# Patient Record
Sex: Male | Born: 1960 | Hispanic: No | Marital: Single | Smoking: Never smoker
Health system: Southern US, Community
[De-identification: ages and names within clinical notes are randomized; demographics above are authoritative.]

## PROBLEM LIST (undated history)

## (undated) DIAGNOSIS — R7303 Prediabetes: Secondary | ICD-10-CM

## (undated) DIAGNOSIS — B019 Varicella without complication: Secondary | ICD-10-CM

## (undated) DIAGNOSIS — R011 Cardiac murmur, unspecified: Secondary | ICD-10-CM

## (undated) DIAGNOSIS — R42 Dizziness and giddiness: Secondary | ICD-10-CM

## (undated) HISTORY — DX: Dizziness and giddiness: R42

## (undated) HISTORY — DX: Prediabetes: R73.03

## (undated) HISTORY — DX: Cardiac murmur, unspecified: R01.1

## (undated) HISTORY — DX: Varicella without complication: B01.9

---

## 2004-09-07 ENCOUNTER — Emergency Department: Payer: Self-pay | Admitting: Unknown Physician Specialty

## 2006-12-16 ENCOUNTER — Encounter: Admission: RE | Admit: 2006-12-16 | Discharge: 2006-12-30 | Payer: Self-pay | Admitting: Family Medicine

## 2008-12-07 ENCOUNTER — Inpatient Hospital Stay: Payer: Self-pay | Admitting: General Surgery

## 2009-05-28 DIAGNOSIS — R011 Cardiac murmur, unspecified: Secondary | ICD-10-CM

## 2009-05-28 HISTORY — DX: Cardiac murmur, unspecified: R01.1

## 2010-05-28 HISTORY — PX: CHOLECYSTECTOMY: SHX55

## 2010-09-29 ENCOUNTER — Emergency Department: Payer: Self-pay | Admitting: Emergency Medicine

## 2012-05-01 ENCOUNTER — Encounter (HOSPITAL_COMMUNITY): Payer: Self-pay | Admitting: *Deleted

## 2012-05-01 ENCOUNTER — Emergency Department (HOSPITAL_COMMUNITY)
Admission: EM | Admit: 2012-05-01 | Discharge: 2012-05-01 | Disposition: A | Discharge status: Home / Self Care | Payer: BC Managed Care – PPO | Attending: Emergency Medicine | Admitting: Emergency Medicine

## 2012-05-01 DIAGNOSIS — H00019 Hordeolum externum unspecified eye, unspecified eyelid: Secondary | ICD-10-CM | POA: Insufficient documentation

## 2012-05-01 DIAGNOSIS — H5789 Other specified disorders of eye and adnexa: Secondary | ICD-10-CM | POA: Insufficient documentation

## 2012-05-01 MED ORDER — TOBRAMYCIN 0.3 % OP SOLN
2.0000 [drp] | OPHTHALMIC | Status: DC
  Administered 2012-05-01: 2 [drp] via OPHTHALMIC
  Filled 2012-05-01: qty 5

## 2012-05-01 NOTE — ED Notes (Signed)
Pt noticed bump to the right eye lid this morning. Pt states right eye lid has some swelling. Pt denies any fever or injury.

## 2012-05-01 NOTE — ED Provider Notes (Signed)
History     CSN: [id number]  Arrival date & time 05/01/12  4098   First MD Initiated Contact with Patient 05/01/12 1945      Chief Complaint  Patient presents with  . Eye Problem    (Consider location/radiation/quality/duration/timing/severity/associated sxs/prior treatment) HPI Comments: Patient states that on yesterday he felt a bump on the top lid of the right eye and scratched it. He states this morning when he got up the upper lip is swollen. The eye was somewhat red. And he's been having a mild discomfort (2/10) most of the day. He's not had any drainage or discharge from the eye. He's not had any vision changes with the eye. And he's not had any problem temperature elevation.  Patient is a 51 y.o. male presenting with eye problem. The history is provided by the patient.  Eye Problem  This is a new problem. The current episode started yesterday. The problem occurs constantly. The problem has not changed since onset.The right eye is affected.There was no injury mechanism. The pain is at a severity of 2/10. Associated symptoms include eye redness. Pertinent negatives include no discharge, no double vision and no photophobia.    History reviewed. No pertinent past medical history.  History reviewed. No pertinent past surgical history.  No family history on file.  History  Substance Use Topics  . Smoking status: Not on file  . Smokeless tobacco: Not on file  . Alcohol Use: No      Review of Systems  Constitutional: Negative for activity change.       All ROS Neg except as noted in HPI  HENT: Negative for nosebleeds and neck pain.   Eyes: Positive for redness and itching. Negative for double vision, photophobia and discharge.  Respiratory: Negative for cough, shortness of breath and wheezing.   Cardiovascular: Negative for chest pain and palpitations.  Gastrointestinal: Negative for abdominal pain and blood in stool.  Genitourinary: Negative for dysuria, frequency and  hematuria.  Musculoskeletal: Negative for back pain and arthralgias.  Skin: Negative.   Neurological: Negative for dizziness, seizures and speech difficulty.  Psychiatric/Behavioral: Negative for hallucinations and confusion.    Allergies  Review of patient's allergies indicates no known allergies.  Home Medications  No current outpatient prescriptions on file.  BP 127/90  Pulse 72  Temp 98.8 F (37.1 C) (Oral)  Resp 20  Ht 6' (1.829 m)  Wt 270 lb (122.471 kg)  BMI 36.62 kg/m2  SpO2 100%  Physical Exam  Nursing note and vitals reviewed. Constitutional: He is oriented to person, place, and time. He appears well-developed and well-nourished.  Non-toxic appearance.  HENT:  Head: Normocephalic.  Right Ear: Tympanic membrane and external ear normal.  Left Ear: Tympanic membrane and external ear normal.  Eyes: EOM and lids are normal. Pupils are equal, round, and reactive to light.       The right upper lid is mildly swollen. There is no drainage or discharge from the eye. Within the upper lid was flipped. There is no pus he had noted there is some mild swelling present. There is mild increased redness of the conjunctiva. The anterior chamber is clear. The extraocular movements are intact. The pupils are equal and reactive to light. There is no pain or redness around the periorbital area.  Neck: Normal range of motion. Neck supple. Carotid bruit is not present.  Cardiovascular: Normal rate, regular rhythm, normal heart sounds, intact distal pulses and normal pulses.   Pulmonary/Chest: Breath sounds normal. No  respiratory distress.  Abdominal: Soft. Bowel sounds are normal. There is no tenderness. There is no guarding.  Musculoskeletal: Normal range of motion.  Lymphadenopathy:       Head (right side): No submandibular adenopathy present.       Head (left side): No submandibular adenopathy present.    He has no cervical adenopathy.  Neurological: He is alert and oriented to person,  place, and time. He has normal strength. No cranial nerve deficit or sensory deficit.  Skin: Skin is warm and dry.  Psychiatric: He has a normal mood and affect. His speech is normal.    ED Course  Procedures (including critical care time)  Labs Reviewed - No data to display No results found.   1. Sty       MDM   I have reviewed nursing notes, vital signs, and all appropriate lab and imaging results for this patient.I suspect the patient has a stye of the right eye. Visual acquity wnl. Will treat with tobramycin eyedrops and warm compresses. Patient is referred to ophthalmology if not improving. Patient also advised to wash hands frequently. He is to return to the emergency department if any urgent changes before he is seen by ophthalmology.       Kathie Dike, [place] 05/01/12 2028

## 2012-05-01 NOTE — ED Notes (Signed)
Patient woke up this morning with right eye pain. Started rubbing it and is red and feels worse.

## 2012-05-01 NOTE — ED Notes (Signed)
Pt alert & oriented x4, stable gait. Patient given discharge instructions, paperwork & prescription(s). Patient  instructed to stop at the registration desk to finish any additional paperwork. Patient verbalized understanding. Pt left department w/ no further questions. 

## 2012-05-02 NOTE — ED Provider Notes (Signed)
Medical screening examination/treatment/procedure(s) were performed by non-physician practitioner and as supervising physician I was immediately available for consultation/collaboration.   Shelda Jakes, MD 05/02/12 [phone]

## 2012-06-28 ENCOUNTER — Encounter (HOSPITAL_COMMUNITY): Payer: Self-pay | Admitting: *Deleted

## 2012-06-28 ENCOUNTER — Emergency Department (HOSPITAL_COMMUNITY)
Admission: EM | Admit: 2012-06-28 | Discharge: 2012-06-28 | Disposition: A | Discharge status: Home / Self Care | Payer: BC Managed Care – PPO | Attending: Emergency Medicine | Admitting: Emergency Medicine

## 2012-06-28 DIAGNOSIS — R51 Headache: Secondary | ICD-10-CM | POA: Insufficient documentation

## 2012-06-28 DIAGNOSIS — R110 Nausea: Secondary | ICD-10-CM | POA: Insufficient documentation

## 2012-06-28 DIAGNOSIS — R197 Diarrhea, unspecified: Secondary | ICD-10-CM | POA: Insufficient documentation

## 2012-06-28 MED ORDER — IBUPROFEN 600 MG PO TABS: 600.0000 mg | ORAL_TABLET | Freq: Three times a day (TID) | ORAL | Status: DC | PRN

## 2012-06-28 MED ORDER — ONDANSETRON 8 MG PO TBDP: 8.0000 mg | ORAL_TABLET | Freq: Three times a day (TID) | ORAL | Status: DC | PRN

## 2012-06-28 MED ORDER — ACETAMINOPHEN 325 MG PO TABS
ORAL_TABLET | ORAL | Status: AC
  Administered 2012-06-28: 650 mg via ORAL
  Filled 2012-06-28: qty 2

## 2012-06-28 MED ORDER — ACETAMINOPHEN 325 MG PO TABS
650.0000 mg | ORAL_TABLET | Freq: Once | ORAL | Status: AC
  Administered 2012-06-28: 650 mg via ORAL

## 2012-06-28 MED ORDER — ONDANSETRON 8 MG PO TBDP
8.0000 mg | ORAL_TABLET | Freq: Once | ORAL | Status: AC
  Administered 2012-06-28: 8 mg via ORAL
  Filled 2012-06-28: qty 1

## 2012-06-28 NOTE — ED Notes (Signed)
Pt states fever and diarrhea began last night. 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

## 2012-06-28 NOTE — ED Provider Notes (Signed)
History     CSN: [id number]  Arrival date & time 06/28/12  1207   First MD Initiated Contact with Patient 06/28/12 1239      Chief Complaint  Patient presents with  . Fever  . Diarrhea     The history is provided by the patient.   patient reports nausea and diarrhea that began last night.  He has had no vomiting.  He continues to tolerate oral fluids at home but states he is having to "force them down".  He's had fever and chills.  He has mild headache at this time.  Weakness of his upper lower extremities.  No abdominal pain.  No melena or hematochezia.  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  No significant medical history  History reviewed. No pertinent past medical history.  Past Surgical History  Procedure Date  . Cholecystectomy     No family history on file.  History  Substance Use Topics  . Smoking status: Never Smoker   . Smokeless tobacco: Not on file  . Alcohol Use: No      Review of Systems  Constitutional: Positive for fever.  Gastrointestinal: Positive for diarrhea.  All other systems reviewed and are negative.    Allergies  Review of patient's allergies indicates no known allergies.  Home Medications   Current Outpatient Rx  Name  Route  Sig  Dispense  Refill  . IBUPROFEN 600 MG PO TABS   Oral   Take 1 tablet (600 mg total) by mouth every 8 (eight) hours as needed for pain.   15 tablet   0   . ONDANSETRON 8 MG PO TBDP   Oral   Take 1 tablet (8 mg total) by mouth every 8 (eight) hours as needed for nausea.   10 tablet   0     BP 127/75  Pulse 113  Temp 99.6 F (37.6 C) (Oral)  Resp 18  Ht 6' (1.829 m)  Wt 270 lb (122.471 kg)  BMI 36.62 kg/m2  SpO2 96%  Physical Exam  Nursing note and vitals reviewed. Constitutional: He is oriented to person, place, and time. He appears well-developed and well-nourished.  HENT:  Head:  Normocephalic and atraumatic.  Eyes: EOM are normal.  Neck: Normal range of motion.  Cardiovascular: Normal rate, regular rhythm, normal heart sounds and intact distal pulses.   Pulmonary/Chest: Effort normal and breath sounds normal. No respiratory distress.  Abdominal: Soft. He exhibits no distension. There is no tenderness.  Musculoskeletal: Normal range of motion.  Neurological: He is alert and oriented to person, place, and time.  Skin: Skin is warm and dry.  Psychiatric: He has a normal mood and affect. Judgment normal.    ED Course  Procedures (including critical care time)  Labs Reviewed - No data to display No results found.   1. Nausea   2. Diarrhea   3. Headache       MDM  1:15 PM Patient was given oral nausea medicine.  Home to orally hydrate himself.  I don't believe the patient needs IV hydration the emergency department.  Headache treated.  Close PCP followup.  He understands return to the ER for new or worsening symptoms        Lyanne Co, MD 06/28/12 1316

## 2013-08-07 ENCOUNTER — Emergency Department: Payer: Self-pay | Admitting: Emergency Medicine

## 2013-08-17 ENCOUNTER — Ambulatory Visit: Payer: Self-pay | Admitting: Family Medicine

## 2013-12-24 LAB — LIPID PANEL
CHOLESTEROL: 196 mg/dL (ref 0–200)
HDL: 36 mg/dL (ref 35–70)
LDL Cholesterol: 114 mg/dL
TRIGLYCERIDES: 229 mg/dL — AB (ref 40–160)

## 2013-12-24 LAB — HEMOGLOBIN A1C: HEMOGLOBIN A1C: 6.1 % — AB (ref 4.0–6.0)

## 2013-12-24 LAB — TSH: TSH: 1.49 u[IU]/mL (ref <=5.90)

## 2014-12-15 ENCOUNTER — Encounter: Payer: Self-pay | Admitting: Primary Care

## 2014-12-15 ENCOUNTER — Ambulatory Visit (INDEPENDENT_AMBULATORY_CARE_PROVIDER_SITE_OTHER): Payer: Managed Care, Other (non HMO) | Admitting: Primary Care

## 2014-12-15 ENCOUNTER — Encounter (INDEPENDENT_AMBULATORY_CARE_PROVIDER_SITE_OTHER): Payer: Self-pay

## 2014-12-15 VITALS — BP 126/84 | HR 72 | Temp 98.2°F | Ht 70.0 in | Wt 263.8 lb

## 2014-12-15 DIAGNOSIS — E669 Obesity, unspecified: Secondary | ICD-10-CM

## 2014-12-15 NOTE — Progress Notes (Signed)
Pre visit review using our clinic review tool, if applicable. No additional management support is needed unless otherwise documented below in the visit note. 

## 2014-12-15 NOTE — Patient Instructions (Signed)
Please schedule a physical with me in the next month at your convienence. You will also schedule a lab only appointment one week prior. We will discuss your lab results during your physical.  It was a pleasure to meet you today! Please don't hesitate to call me with any questions. Welcome to [facility]!

## 2014-12-15 NOTE — Assessment & Plan Note (Signed)
Endorses healthy diet. Limited exercise. Will do labs and further evaluation at upcoming physical.

## 2014-12-15 NOTE — Progress Notes (Signed)
   Subjective:    Patient ID: Anastasia Fiedler., male    DOB: July 24, 1960, 54 y.o.   MRN: [id number]  HPI  Mr. Reznick is a 54 year old male who presents today to establish care and discuss the problems mentioned below. Will obtain old records.  1) Obesity: Endorses healthy lifestyle. Breakfast:Turkey sausage and egg whites, fruit, whole wheat toast, orange juice. Lunch: Grilled chicken, vegetables, baked seafood, soup. Dinner: Sub sandwich, baked fish or chicken, vegetables. Drinks mostly water or juice.  Does not eat pork or consume sodas. Limited consumption of sweets. He exercises by doing 30 minutes of cardio 3 days a week. His occupation is sedentary.  Review of Systems  Constitutional: Negative for fatigue and unexpected weight change.  HENT: Negative for rhinorrhea.   Respiratory: Negative for cough and shortness of breath.   Cardiovascular: Negative for chest pain.  Gastrointestinal: Negative for diarrhea and constipation.  Genitourinary: Negative for difficulty urinating.  Musculoskeletal: Negative for myalgias and arthralgias.  Skin: Negative for rash.  Neurological: Negative for dizziness, numbness and headaches.  Psychiatric/Behavioral:       Denies concerns for anxiety or depression.       Past Medical History  Diagnosis Date  . Chicken pox   . Heart murmur 2011    History   Social History  . Marital Status: Unknown    Spouse Name: N/A  . Number of Children: N/A  . Years of Education: N/A   Occupational History  . Not on file.   Social History Main Topics  . Smoking status: Never Smoker   . Smokeless tobacco: Not on file  . Alcohol Use: No  . Drug Use: No  . Sexual Activity: Not on file   Other Topics Concern  . 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   Works as a Marine scientist.   Highest level of education is bachelors.   Works for [employer].   Enjoys traveling, reading, collecting coins.    Past Surgical History    Procedure Laterality Date  . Cholecystectomy  2012    Family History  Problem Relation Age of Onset  . Diabetes Mother   . Diabetes Father   . Heart disease Father 96    Myocardial  . Kidney disease Father   . Stroke Father   . Hypertension Father     No Known Allergies  No current outpatient prescriptions on file prior to visit.   No current facility-administered medications on file prior to visit.    BP 126/84 mmHg  Pulse 72  Temp(Src) 98.2 F (36.8 C) (Oral)  Ht 5\' 10"  (1.778 m)  Wt 263 lb 12.8 oz (119.659 kg)  BMI 37.85 kg/m2  SpO2 98%    Objective:   Physical Exam  Constitutional: He is oriented to person, place, and time. He appears well-nourished.  Cardiovascular: Normal rate and regular rhythm.   Pulmonary/Chest: Effort normal and breath sounds normal.  Neurological: He is alert and oriented to person, place, and time.  Skin: Skin is warm and dry.  Psychiatric: He has a normal mood and affect.          Assessment & Plan:

## 2014-12-17 ENCOUNTER — Other Ambulatory Visit: Payer: Self-pay | Admitting: Primary Care

## 2014-12-17 DIAGNOSIS — Z0000 Encounter for general adult medical examination without abnormal findings: Secondary | ICD-10-CM

## 2014-12-20 ENCOUNTER — Other Ambulatory Visit (INDEPENDENT_AMBULATORY_CARE_PROVIDER_SITE_OTHER): Payer: Managed Care, Other (non HMO)

## 2014-12-20 DIAGNOSIS — R7989 Other specified abnormal findings of blood chemistry: Secondary | ICD-10-CM

## 2014-12-20 DIAGNOSIS — Z0000 Encounter for general adult medical examination without abnormal findings: Secondary | ICD-10-CM

## 2014-12-20 LAB — LDL CHOLESTEROL, DIRECT: Direct LDL: 153 mg/dL

## 2014-12-20 LAB — COMPREHENSIVE METABOLIC PANEL
ALBUMIN: 4.5 g/dL (ref 3.5–5.2)
ALT: 40 U/L (ref 0–53)
AST: 22 U/L (ref 0–37)
Alkaline Phosphatase: 46 U/L (ref 39–117)
BILIRUBIN TOTAL: 0.5 mg/dL (ref 0.2–1.2)
BUN: 18 mg/dL (ref 6–23)
CO2: 27 meq/L (ref 19–32)
Calcium: 10.1 mg/dL (ref 8.4–10.5)
Chloride: 105 mEq/L (ref 96–112)
Creatinine, Ser: 1.09 mg/dL (ref 0.40–1.50)
GFR: 75.55 mL/min (ref >60.00)
GLUCOSE: 97 mg/dL (ref 70–99)
Potassium: 4.7 mEq/L (ref 3.5–5.1)
Sodium: 140 mEq/L (ref 135–145)
TOTAL PROTEIN: 7.3 g/dL (ref 6.0–8.3)

## 2014-12-20 LAB — CBC
HEMATOCRIT: 46.2 % (ref 39.0–52.0)
HEMOGLOBIN: 15.5 g/dL (ref 13.0–17.0)
MCHC: 33.5 g/dL (ref 30.0–36.0)
MCV: 88.7 fl (ref 78.0–100.0)
PLATELETS: 260 10*3/uL (ref 150.0–400.0)
RBC: 5.21 Mil/uL (ref 4.22–5.81)
RDW: 13.2 % (ref 11.5–15.5)
WBC: 7.3 10*3/uL (ref 4.0–10.5)

## 2014-12-20 LAB — LIPID PANEL
CHOL/HDL RATIO: 5
Cholesterol: 238 mg/dL — ABNORMAL HIGH (ref 0–200)
HDL: 45.4 mg/dL (ref >39.00)
NONHDL: 192.6
Triglycerides: 211 mg/dL — ABNORMAL HIGH (ref 0.0–149.0)
VLDL: 42.2 mg/dL — AB (ref 0.0–40.0)

## 2014-12-20 LAB — PSA: PSA: 0.54 ng/mL (ref 0.10–4.00)

## 2014-12-20 LAB — TSH: TSH: 1.71 u[IU]/mL (ref 0.35–4.50)

## 2014-12-20 LAB — HEMOGLOBIN A1C: HEMOGLOBIN A1C: 5.7 % (ref 4.6–6.5)

## 2014-12-27 ENCOUNTER — Encounter: Payer: Self-pay | Admitting: Internal Medicine

## 2014-12-27 ENCOUNTER — Ambulatory Visit (INDEPENDENT_AMBULATORY_CARE_PROVIDER_SITE_OTHER): Payer: Managed Care, Other (non HMO) | Admitting: Primary Care

## 2014-12-27 ENCOUNTER — Encounter: Payer: Self-pay | Admitting: Primary Care

## 2014-12-27 VITALS — BP 118/82 | HR 71 | Temp 98.0°F | Ht 70.0 in | Wt 263.1 lb

## 2014-12-27 DIAGNOSIS — Z0000 Encounter for general adult medical examination without abnormal findings: Secondary | ICD-10-CM | POA: Insufficient documentation

## 2014-12-27 DIAGNOSIS — Z0001 Encounter for general adult medical examination with abnormal findings: Secondary | ICD-10-CM | POA: Insufficient documentation

## 2014-12-27 DIAGNOSIS — E669 Obesity, unspecified: Secondary | ICD-10-CM

## 2014-12-27 DIAGNOSIS — Z1211 Encounter for screening for malignant neoplasm of colon: Secondary | ICD-10-CM

## 2014-12-27 NOTE — Assessment & Plan Note (Signed)
Tetanus and flu UTD. Referral made for colonoscopy. Labs mostly unremarkable. Slightly elevated cholesterol and A1C of 5.7 Discussed importance of diet and exercise on health. He will start exercising. Exam unremarkable. Follow up in 3 months evaluation.

## 2014-12-27 NOTE — Patient Instructions (Signed)
Work to increase your activity. You need at least 1 hour of moderate intensity exercise 5 days weekly.  Work to decrease portion sizes.  Follow up in 3 months for repeat diabetes and cholesterol testing.  It was nice to see you!

## 2014-12-27 NOTE — Assessment & Plan Note (Addendum)
Endorses healthy diet. Discussed to cut back on juice and portion sizes. He is to start exercising 1 hour 5 days a week.

## 2014-12-27 NOTE — Progress Notes (Signed)
Subjective:    Patient ID: Stephen Ramos., male    DOB: 1960/11/07, 54 y.o.   MRN: [id number]  HPI  Stephen Ramos is a 54 year old male who presents today for complete physical.  Immunizations: -Tetanus: TD completed in 2010. -Influenza: Completed last season.   Diet: Endorses a healthy diet. Breakfast: [place] sausage, egg whites, fruit, whole wheat toast, juice. Lunch: Grilled chicken, vegetables, baked fish, soup Dinner: Sandwiches, baked lean meats, vegetables. No pork or red meat. Beverages: Drinks mostly water. Exercise: He is doing 30 minutes of cardio three days a week. (Walking around his neighborhood and elliptical). Overall his job is very sedentary. Eye exam: Completed 3 years ago. Denies changes in vision. Dental exam: Completes once every six months. Colonoscopy: He has not completed before.  Wt Readings from Last 3 Encounters:  12/27/14 263 lb 1.9 oz (119.35 kg)  12/15/14 263 lb 12.8 oz (119.659 kg)  10/13/14 261 lb (118.389 kg)     Review of Systems  Constitutional: Negative for unexpected weight change.  HENT: Negative for rhinorrhea.   Respiratory: Negative for cough and shortness of breath.   Cardiovascular: Negative for chest pain.  Gastrointestinal: Negative for diarrhea and constipation.  Genitourinary: Negative for difficulty urinating.  Musculoskeletal: Negative for myalgias and arthralgias.  Skin: Negative for rash.  Allergic/Immunologic: Negative for environmental allergies.  Neurological: Negative for dizziness, numbness and headaches.  Psychiatric/Behavioral:       Denies concerns for anxiety or depression       Past Medical History  Diagnosis Date  . Chicken pox   . Heart murmur 2011    History   Social History  . Marital Status: Unknown    Spouse Name: N/A  . Number of Children: N/A  . Years of Education: N/A   Occupational History  . Not on file.   Social History Main Topics  . Smoking status: Never Smoker   .  Smokeless tobacco: Not on file  . Alcohol Use: No  . Drug Use: No  . Sexual Activity: Not on file   Other Topics Concern  . 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   Works as a Marine scientist.   Highest level of education is bachelors.   Works for [employer].   Enjoys traveling, reading, collecting coins.    Past Surgical History  Procedure Laterality Date  . Cholecystectomy  2012    Family History  Problem Relation Age of Onset  . Diabetes Mother   . Diabetes Father   . Heart disease Father 48    Myocardial  . Kidney disease Father   . Stroke Father   . Hypertension Father     No Known Allergies  Current Outpatient Prescriptions on File Prior to Visit  Medication Sig Dispense Refill  . MULTIPLE VITAMIN PO Take 1 tablet by mouth daily.      No current facility-administered medications on file prior to visit.    BP 118/82 mmHg  Pulse 71  Temp(Src) 98 F (36.7 C) (Oral)  Ht 5\' 10"  (1.778 m)  Wt 263 lb 1.9 oz (119.35 kg)  BMI 37.75 kg/m2  SpO2 98%     Objective:   Physical Exam  Constitutional: He is oriented to person, place, and time. He appears well-nourished.  HENT:  Right Ear: Tympanic membrane and ear canal normal.  Left Ear: Tympanic membrane and ear canal normal.  Nose: Nose normal.  Mouth/Throat: Oropharynx is clear and moist.  Eyes: Conjunctivae and EOM are normal. Pupils are equal, round, and reactive to light.  Neck: Neck supple.  Cardiovascular: Normal rate and regular rhythm.   Pulmonary/Chest: Effort normal and breath sounds normal.  Abdominal: Soft. Bowel sounds are normal.  Musculoskeletal: Normal range of motion.  Lymphadenopathy:    He has no cervical adenopathy.  Neurological: He is alert and oriented to person, place, and time. He has normal reflexes.  Skin: Skin is warm and dry.  Psychiatric: He has a normal mood and affect.          Assessment & Plan:

## 2014-12-27 NOTE — Progress Notes (Signed)
Pre visit review using our clinic review tool, if applicable. No additional management support is needed unless otherwise documented below in the visit note. 

## 2015-03-11 ENCOUNTER — Ambulatory Visit (AMBULATORY_SURGERY_CENTER): Payer: Self-pay

## 2015-03-11 VITALS — Ht 72.0 in | Wt 263.0 lb

## 2015-03-11 DIAGNOSIS — Z1211 Encounter for screening for malignant neoplasm of colon: Secondary | ICD-10-CM

## 2015-03-11 MED ORDER — SUPREP BOWEL PREP KIT 17.5-3.13-1.6 GM/177ML PO SOLN: 1.0000 | Freq: Once | ORAL | Status: DC

## 2015-03-11 NOTE — Progress Notes (Signed)
No allergies to eggs or soy No past problems with anesthesia No diet/weight loss meds No home oxygen  Has email  Emmi instructions given for colonoscopy 

## 2015-03-21 ENCOUNTER — Encounter: Payer: Self-pay | Admitting: Internal Medicine

## 2015-03-21 ENCOUNTER — Ambulatory Visit (AMBULATORY_SURGERY_CENTER): Payer: Managed Care, Other (non HMO) | Admitting: Internal Medicine

## 2015-03-21 VITALS — BP 124/78 | HR 73 | Temp 96.5°F | Resp 13 | Ht 72.0 in | Wt 263.0 lb

## 2015-03-21 DIAGNOSIS — D125 Benign neoplasm of sigmoid colon: Secondary | ICD-10-CM | POA: Diagnosis not present

## 2015-03-21 DIAGNOSIS — K635 Polyp of colon: Secondary | ICD-10-CM | POA: Diagnosis not present

## 2015-03-21 DIAGNOSIS — Z1211 Encounter for screening for malignant neoplasm of colon: Secondary | ICD-10-CM

## 2015-03-21 MED ORDER — SODIUM CHLORIDE 0.9 % IV SOLN: 500.0000 mL | INTRAVENOUS | Status: DC

## 2015-03-21 NOTE — Progress Notes (Signed)
  [facility] Post-op Note  Patient: Stephen Ramos.  Procedure(s) Performed: colonoscopy  Patient Location: [facility] - Recovery Area  Anesthesia Type: Deep Sedation/Propofol  Level of Consciousness: awake, oriented and patient cooperative  Airway and Oxygen Therapy: Patient Spontanous Breathing  Post-op Pain: none  Post-op Assessment:  Post-op Vital signs reviewed, Patient's Cardiovascular Status Stable, Respiratory Function Stable, Patent Airway, No signs of Nausea or vomiting and Pain level controlled  Post-op Vital Signs: Reviewed and stable  Complications: No apparent anesthesia complications  Wynona Duhamel E 9:37 AM

## 2015-03-21 NOTE — Patient Instructions (Signed)
YOU HAD AN ENDOSCOPIC PROCEDURE TODAY AT [facility]:   Refer to the procedure report that was given to you for any specific questions about what was found during the examination.  If the procedure report does not answer your questions, please call your gastroenterologist to clarify.  If you requested that your care partner not be given the details of your procedure findings, then the procedure report has been included in a sealed envelope for you to review at your convenience later.  YOU SHOULD EXPECT: Some feelings of bloating in the abdomen. Passage of more gas than usual.  Walking can help get rid of the air that was put into your GI tract during the procedure and reduce the bloating. If you had a lower endoscopy (such as a colonoscopy or flexible sigmoidoscopy) you may notice spotting of blood in your stool or on the toilet paper. If you underwent a bowel prep for your procedure, you may not have a normal bowel movement for a few days.  Please Note:  You might notice some irritation and congestion in your nose or some drainage.  This is from the oxygen used during your procedure.  There is no need for concern and it should clear up in a day or so.  SYMPTOMS TO REPORT IMMEDIATELY:   Following lower endoscopy (colonoscopy or flexible sigmoidoscopy):  Excessive amounts of blood in the stool  Significant tenderness or worsening of abdominal pains  Swelling of the abdomen that is new, acute  Fever of 100F or higher    For urgent or emergent issues, a gastroenterologist can be reached at any hour by calling [phone].   DIET: Your first meal following the procedure should be a small meal and then it is ok to progress to your normal diet. Heavy or fried foods are harder to digest and may make you feel nauseous or bloated.  Likewise, meals heavy in dairy and vegetables can increase bloating.  Drink plenty of fluids but you should avoid alcoholic beverages for 24  hours.  ACTIVITY:  You should plan to take it easy for the rest of today and you should NOT DRIVE or use heavy machinery until tomorrow (because of the sedation medicines used during the test).    FOLLOW UP: Our staff will call the number listed on your records the next business day following your procedure to check on you and address any questions or concerns that you may have regarding the information given to you following your procedure. If we do not reach you, we will leave a message.  However, if you are feeling well and you are not experiencing any problems, there is no need to return our call.  We will assume that you have returned to your regular daily activities without incident.  If any biopsies were taken you will be contacted by phone or by letter within the next 1-3 weeks.  Please call us at [phone] if you have not heard about the biopsies in 3 weeks.    SIGNATURES/CONFIDENTIALITY: You and/or your care partner have signed paperwork which will be entered into your electronic medical record.  These signatures attest to the fact that that the information above on your After Visit Summary has been reviewed and is understood.  Full responsibility of the confidentiality of this discharge information lies with you and/or your care-partner.  Next colonoscopy determined by pathology results; 5 or 10 years. Please review polyp handout provided.

## 2015-03-21 NOTE — Op Note (Signed)
[facility]  [address] [place], [id number]   COLONOSCOPY PROCEDURE REPORT  PATIENT: Stephen Ramos, Stephen Ramos  MR#: [id number] BIRTHDATE: Dec 02, 1960 , 34  yrs. old GENDER: male ENDOSCOPIST: Jerene Bears, MD REFERRED BY: Alma Friendly PROCEDURE DATE:  03/21/2015 PROCEDURE:   Colonoscopy, screening and Colonoscopy with cold biopsy polypectomy First Screening Colonoscopy - Avg.  risk and is 50 yrs.  old or older Yes.  Prior Negative Screening - Now for repeat screening. N/A  History of Adenoma - Now for follow-up colonoscopy & has been > or = to 3 yrs.  N/A  Polyps removed today? Yes ASA CLASS:   Class II INDICATIONS:Screening for colonic neoplasia, Colorectal Neoplasm Risk Assessment for this procedure is average risk, and 1st colonoscopy. MEDICATIONS: Monitored anesthesia care and Propofol 300 mg IV  DESCRIPTION OF PROCEDURE:   After the risks benefits and alternatives of the procedure were thoroughly explained, informed consent was obtained.  The digital rectal exam revealed no rectal mass.   The LB [id number]  endoscope was introduced through the anus and advanced to the cecum, which was identified by both the appendix and ileocecal valve. No adverse events experienced. The quality of the prep was excellent.  (Suprep was used)  The instrument was then slowly withdrawn as the colon was fully examined. Estimated blood loss is zero unless otherwise noted in this procedure report.  COLON FINDINGS: A sessile polyp measuring 3 mm in size was found in the sigmoid colon.  A polypectomy was performed with cold forceps. The resection was complete, the polyp tissue was completely retrieved and sent to histology.   The examination was otherwise normal.  Retroflexed views revealed small internal hemorrhoids. The time to cecum = 1.7 Withdrawal time = 14.6   The scope was withdrawn and the procedure completed. COMPLICATIONS: There were no immediate  complications.  ENDOSCOPIC IMPRESSION: 1.   Sessile polyp was found in the sigmoid colon; polypectomy was performed with cold forceps 2.   The examination was otherwise normal  RECOMMENDATIONS: 1.  Await pathology results 2.  If the polyp removed today is proven to be an adenomatous (pre-cancerous) polyp, you will need a repeat colonoscopy in 5 years.  Otherwise you should continue to follow colorectal cancer screening guidelines for "routine risk" patients with colonoscopy in 10 years.  You will receive a letter within 1-2 weeks with the results of your biopsy as well as final recommendations.  Please call my office if you have not received a letter after 3 weeks.  eSigned:  Jerene Bears, MD 03/21/2015 9:34 AM   cc:  the patient, PCP

## 2015-03-21 NOTE — Progress Notes (Signed)
Called to room to assist during endoscopic procedure.  Patient ID and intended procedure confirmed with present staff. Received instructions for my participation in the procedure from the performing physician.  

## 2015-03-22 ENCOUNTER — Telehealth: Payer: Self-pay | Admitting: *Deleted

## 2015-03-22 NOTE — Telephone Encounter (Signed)
  Follow up Call-  Call back number 03/21/2015  Post procedure Call Back phone  # [phone]  Permission to leave phone message Yes     Patient questions:  Do you have a fever, pain , or abdominal swelling? No. Pain Score  0 *  Have you tolerated food without any problems? Yes.    Have you been able to return to your normal activities? Yes.    Do you have any questions about your discharge instructions: Diet   No. Medications  No. Follow up visit  No.  Do you have questions or concerns about your Care? No.  Actions: * If pain score is 4 or above: No action needed, pain <4.

## 2015-03-24 ENCOUNTER — Encounter: Payer: Self-pay | Admitting: Internal Medicine

## 2015-03-29 ENCOUNTER — Other Ambulatory Visit: Payer: Self-pay | Admitting: Primary Care

## 2015-03-29 DIAGNOSIS — E785 Hyperlipidemia, unspecified: Secondary | ICD-10-CM

## 2015-04-01 ENCOUNTER — Other Ambulatory Visit: Payer: Managed Care, Other (non HMO)

## 2015-04-04 ENCOUNTER — Ambulatory Visit: Payer: Managed Care, Other (non HMO) | Admitting: Primary Care

## 2015-04-12 ENCOUNTER — Other Ambulatory Visit: Payer: Self-pay | Admitting: Internal Medicine

## 2015-04-18 ENCOUNTER — Other Ambulatory Visit: Payer: Managed Care, Other (non HMO)

## 2015-04-20 ENCOUNTER — Ambulatory Visit: Payer: Managed Care, Other (non HMO) | Admitting: Primary Care

## 2015-05-12 ENCOUNTER — Other Ambulatory Visit: Payer: Self-pay | Admitting: Primary Care

## 2015-05-20 ENCOUNTER — Other Ambulatory Visit: Payer: Self-pay | Admitting: Primary Care

## 2015-05-24 ENCOUNTER — Other Ambulatory Visit: Payer: Managed Care, Other (non HMO)

## 2015-05-26 ENCOUNTER — Ambulatory Visit: Payer: Managed Care, Other (non HMO) | Admitting: Primary Care

## 2015-07-12 ENCOUNTER — Other Ambulatory Visit: Payer: Managed Care, Other (non HMO)

## 2015-07-14 ENCOUNTER — Ambulatory Visit: Payer: Managed Care, Other (non HMO) | Admitting: Primary Care

## 2015-09-13 ENCOUNTER — Ambulatory Visit: Payer: Managed Care, Other (non HMO) | Admitting: Primary Care

## 2015-09-13 ENCOUNTER — Other Ambulatory Visit: Payer: Managed Care, Other (non HMO)

## 2015-09-15 ENCOUNTER — Ambulatory Visit: Payer: Managed Care, Other (non HMO) | Admitting: Primary Care

## 2015-11-22 ENCOUNTER — Other Ambulatory Visit: Payer: Managed Care, Other (non HMO)

## 2015-11-24 ENCOUNTER — Ambulatory Visit: Payer: Managed Care, Other (non HMO) | Admitting: Primary Care

## 2016-01-24 ENCOUNTER — Other Ambulatory Visit: Payer: Managed Care, Other (non HMO)

## 2016-01-26 ENCOUNTER — Ambulatory Visit: Payer: Managed Care, Other (non HMO) | Admitting: Primary Care

## 2016-03-15 ENCOUNTER — Encounter (INDEPENDENT_AMBULATORY_CARE_PROVIDER_SITE_OTHER): Payer: Self-pay

## 2016-03-15 ENCOUNTER — Encounter: Payer: Self-pay | Admitting: Primary Care

## 2016-03-15 ENCOUNTER — Other Ambulatory Visit (INDEPENDENT_AMBULATORY_CARE_PROVIDER_SITE_OTHER): Payer: Managed Care, Other (non HMO)

## 2016-03-15 ENCOUNTER — Ambulatory Visit (INDEPENDENT_AMBULATORY_CARE_PROVIDER_SITE_OTHER): Payer: Managed Care, Other (non HMO) | Admitting: Primary Care

## 2016-03-15 VITALS — BP 126/88 | HR 74 | Temp 98.2°F | Ht 70.0 in | Wt 263.1 lb

## 2016-03-15 DIAGNOSIS — H538 Other visual disturbances: Secondary | ICD-10-CM

## 2016-03-15 DIAGNOSIS — Z0000 Encounter for general adult medical examination without abnormal findings: Secondary | ICD-10-CM | POA: Diagnosis not present

## 2016-03-15 LAB — LIPID PANEL
CHOLESTEROL: 213 mg/dL — AB (ref 0–200)
HDL: 35.4 mg/dL — AB (ref >39.00)
LDL Cholesterol: 143 mg/dL — ABNORMAL HIGH (ref 0–99)
NonHDL: 177.65
TRIGLYCERIDES: 174 mg/dL — AB (ref 0.0–149.0)
Total CHOL/HDL Ratio: 6
VLDL: 34.8 mg/dL (ref 0.0–40.0)

## 2016-03-15 LAB — COMPREHENSIVE METABOLIC PANEL
ALK PHOS: 49 U/L (ref 39–117)
ALT: 37 U/L (ref 0–53)
AST: 21 U/L (ref 0–37)
Albumin: 4.3 g/dL (ref 3.5–5.2)
BUN: 20 mg/dL (ref 6–23)
CHLORIDE: 104 meq/L (ref 96–112)
CO2: 26 meq/L (ref 19–32)
Calcium: 9.8 mg/dL (ref 8.4–10.5)
Creatinine, Ser: 1.15 mg/dL (ref 0.40–1.50)
GFR: 70.68 mL/min (ref >60.00)
GLUCOSE: 112 mg/dL — AB (ref 70–99)
POTASSIUM: 4.6 meq/L (ref 3.5–5.1)
SODIUM: 138 meq/L (ref 135–145)
TOTAL PROTEIN: 7.6 g/dL (ref 6.0–8.3)
Total Bilirubin: 0.4 mg/dL (ref 0.2–1.2)

## 2016-03-15 LAB — TSH: TSH: 1.81 u[IU]/mL (ref 0.35–4.50)

## 2016-03-15 LAB — HEMOGLOBIN A1C: Hgb A1c MFr Bld: 5.9 % (ref 4.6–6.5)

## 2016-03-15 NOTE — Progress Notes (Signed)
Subjective:    Patient ID: Stephen Ramos., male    DOB: [birth year], 55 y.o.   MRN: [id number]  HPI  [name] is a 55 year old male who presents today with a chief complaint of headache. His headache is located to his occipital lobes, right frontal lobe and right parietal lobe. He describes his pain as tightness/tension. He's been taking tylenol or Advil once every 2-3 days with temporary improvement. He does have photophobia, dizziness.Over the past several weeks he's noticed blurred/changes in vision and has an appointment scheduled with the optometrist later today. He denies nausea and phonophobia, changes in speech, weakness, numbness/tingling.  Review of Systems  Eyes: Positive for photophobia and visual disturbance.  Respiratory: Negative for shortness of breath.   Cardiovascular: Negative for chest pain.  Neurological: Positive for dizziness. Negative for weakness and numbness.       Past Medical History:  Diagnosis Date  . Chicken pox   . Heart murmur 2011     Social History   Social History  . Marital status: Unknown    Spouse name: N/A  . Number of children: N/A  . Years of education: N/A   Occupational History  . Not on file.   Social History Main Topics  . Smoking status: Never Smoker  . Smokeless tobacco: Never Used  . Alcohol use No  . Drug use: No  . Sexual activity: Not on file   Other Topics Concern  . 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   Works as a Marine scientist.   Highest level of education is bachelors.   Works for [employer].   Enjoys traveling, reading, collecting coins.    Past Surgical History:  Procedure Laterality Date  . CHOLECYSTECTOMY  2012    Family History  Problem Relation Age of Onset  . Diabetes Mother   . Diabetes Father   . Heart disease Father 17    Myocardial  . Kidney disease Father   . Stroke Father   . Hypertension Father   . Colon cancer Neg Hx     No Known  Allergies  Current Outpatient Prescriptions on File Prior to Visit  Medication Sig Dispense Refill  . MULTIPLE VITAMIN PO Take 1 tablet by mouth daily.      No current facility-administered medications on file prior to visit.     BP 126/88   Pulse 74   Temp 98.2 F (36.8 C) (Oral)   Ht 5\' 10"  (1.778 m)   Wt 263 lb 1.9 oz (119.4 kg)   SpO2 98%   BMI 37.75 kg/m    Objective:   Physical Exam  Constitutional: He is oriented to person, place, and time. He appears well-nourished.  Eyes: EOM are normal. Pupils are equal, round, and reactive to light.  Neck: Neck supple.  Cardiovascular: Normal rate and regular rhythm.   Pulmonary/Chest: Effort normal and breath sounds normal.  Neurological: He is alert and oriented to person, place, and time. No cranial nerve deficit.          Assessment & Plan:  Headache:  Located to occipital, right parietal, right frontal lobes x 2 weeks. Also with visual changes, dizziness, photophobia. Neuro exam today unremarkable. Has an appointment with the Optometrist today. Suspect occular headache due to changes in vision, will check labs to rule out any other metabolic cause. He will update once he's visited the optometrist. He declines IM Tordal today. Will have him start  Ibprofen 600 mg TID PRN.  Sheral Flow, NP

## 2016-03-15 NOTE — Patient Instructions (Signed)
Your headache is likely related to changes in vision.  Complete lab work prior to leaving today. I will notify you of your results once received.   Start Ibuprofen 600 mg three times daily as needed for headache.  Follow up with the optometrist as scheduled.  I will see you in 1 week for your annual physical exam.  It was a pleasure to see you today!

## 2016-03-20 ENCOUNTER — Other Ambulatory Visit: Payer: Managed Care, Other (non HMO)

## 2016-03-22 ENCOUNTER — Ambulatory Visit: Payer: Managed Care, Other (non HMO) | Admitting: Primary Care

## 2016-03-22 ENCOUNTER — Ambulatory Visit (INDEPENDENT_AMBULATORY_CARE_PROVIDER_SITE_OTHER): Payer: Managed Care, Other (non HMO) | Admitting: Primary Care

## 2016-03-22 ENCOUNTER — Encounter: Payer: Self-pay | Admitting: Primary Care

## 2016-03-22 DIAGNOSIS — E785 Hyperlipidemia, unspecified: Secondary | ICD-10-CM | POA: Insufficient documentation

## 2016-03-22 DIAGNOSIS — Z0000 Encounter for general adult medical examination without abnormal findings: Secondary | ICD-10-CM | POA: Diagnosis not present

## 2016-03-22 DIAGNOSIS — E782 Mixed hyperlipidemia: Secondary | ICD-10-CM | POA: Diagnosis not present

## 2016-03-22 DIAGNOSIS — R7303 Prediabetes: Secondary | ICD-10-CM

## 2016-03-22 DIAGNOSIS — E669 Obesity, unspecified: Secondary | ICD-10-CM | POA: Diagnosis not present

## 2016-03-22 NOTE — Progress Notes (Signed)
Pre visit review using our clinic review tool, if applicable. No additional management support is needed unless otherwise documented below in the visit note. 

## 2016-03-22 NOTE — Patient Instructions (Addendum)
Your cholesterol is slightly too high and your blood sugars are trending too high. You must work on changes in your diet and start regularly exercising.  It's importance to improve your diet by reducing consumption processed snacks, sweets, sugary drinks such as juice. Increase consumption of fresh vegetables and fruits, whole grains, water.  Ensure you are drinking 64 ounces of water daily.  Start exercising. You should be getting 150 minutes of moderate intensity exercise weekly.  Schedule a lab only appointment in 6 months for recheck of your cholesterol and A1C.  It was a pleasure to see you today!  Prediabetes Eating Plan Prediabetes--also called impaired glucose tolerance or impaired fasting glucose--is a condition that causes blood sugar (blood glucose) levels to be higher than normal. Following a healthy diet can help to keep prediabetes under control. It can also help to lower the risk of type 2 diabetes and heart disease, which are increased in people who have prediabetes. Along with regular exercise, a healthy diet:  Promotes weight loss.  Helps to control blood sugar levels.  Helps to improve the way that the body uses insulin. WHAT DO I NEED TO KNOW ABOUT THIS EATING PLAN?  Use the glycemic index (GI) to plan your meals. The index tells you how quickly a food will raise your blood sugar. Choose low-GI foods. These foods take a longer time to raise blood sugar.  Pay close attention to the amount of carbohydrates in the food that you eat. Carbohydrates increase blood sugar levels.  Keep track of how many calories you take in. Eating the right amount of calories will help you to achieve a healthy weight. Losing about 7 percent of your starting weight can help to prevent type 2 diabetes.  You may want to follow a Mediterranean diet. This diet includes a lot of vegetables, lean meats or fish, whole grains, fruits, and healthy oils and fats. WHAT FOODS CAN I EAT? Grains Whole  grains, such as whole-wheat or whole-grain breads, crackers, cereals, and pasta. Unsweetened oatmeal. Bulgur. Barley. Quinoa. Brown rice. Corn or whole-wheat flour tortillas or taco shells. Vegetables Lettuce. Spinach. Peas. Beets. Cauliflower. Cabbage. Broccoli. Carrots. Tomatoes. Squash. Eggplant. Herbs. Peppers. Onions. Cucumbers. Brussels sprouts. Fruits Berries. Bananas. Apples. Oranges. Grapes. Papaya. Mango. Pomegranate. Kiwi. Grapefruit. Cherries. Meats and Other Protein Sources Seafood. Lean meats, such as chicken and [place] or lean cuts of pork and beef. Tofu. Eggs. Nuts. Beans. Dairy Low-fat or fat-free dairy products, such as yogurt, cottage cheese, and cheese. Beverages Water. Tea. Coffee. Sugar-free or diet soda. Seltzer water. Milk. Milk alternatives, such as soy or almond milk. Condiments Mustard. Relish. Low-fat, low-sugar ketchup. Low-fat, low-sugar barbecue sauce. Low-fat or fat-free mayonnaise. Sweets and Desserts Sugar-free or low-fat pudding. Sugar-free or low-fat ice cream and other frozen treats. Fats and Oils Avocado. Walnuts. Olive oil. The items listed above may not be a complete list of recommended foods or beverages. Contact your dietitian for more options.  WHAT FOODS ARE NOT RECOMMENDED? Grains Refined white flour and flour products, such as bread, pasta, snack foods, and cereals. Beverages Sweetened drinks, such as sweet iced tea and soda. Sweets and Desserts Baked goods, such as cake, cupcakes, pastries, cookies, and cheesecake. The items listed above may not be a complete list of foods and beverages to avoid. Contact your dietitian for more information.   This information is not intended to replace advice given to you by your health care provider. Make sure you discuss any questions you have with your health care  provider.   Document Released: 09/28/2014 Document Reviewed: 09/28/2014 Elsevier Interactive Patient Education [employer].

## 2016-03-22 NOTE — Assessment & Plan Note (Signed)
Discussed to reduce consumption of sugar, increase vegetables, fruit, whole grains.

## 2016-03-22 NOTE — Assessment & Plan Note (Signed)
A1C of 5.9 on recent labs. Discussed to decrease juice, sweets, processed snacks. Start regularly exercising. Repeat in 6 months. Information provided regarding prediabetes.

## 2016-03-22 NOTE — Assessment & Plan Note (Signed)
Immunizations UTD. PSA UTD. Colonoscopy UTD. Discussed the importance of a healthy diet and regular exercise in order for weight loss, and to reduce the risk of other medical diseases. Exam unremarkable. Labs with hyperlipidemia and prediabetes, will closely follow. Repeat labs in 6 months, follow up in 1 year.

## 2016-03-22 NOTE — Progress Notes (Signed)
Subjective:    Patient ID: Stephen Fiedler., male    DOB: 11/02/1960, 55 y.o.   MRN: [id number]  HPI  [name] is a 55 year old male who presents today for complete physical.  Immunizations: -Tetanus: Completed in 2009 -Influenza: Completed in Fall 2017  Diet: He endorses a healthy diet Breakfast: [place] sausage, egg whites, wheat toast Lunch: Baked chicken, vegetables Dinner: Baked seafood, beef, potato, vegetable Snacks: Granola bars, fruit Desserts: Daily  Beverages: Water, juice, un-sweet tea  Exercise: He does not exercise regularly, but will try to walk several times weekly or using the elliptical.  Eye exam: Completed last week, will be getting glasses Dental exam: Completes semi-annually Colonoscopy: Completed in 2016, due in 2026 PSA: Completed in 2016, normal.   Review of Systems  Constitutional: Negative for unexpected weight change.  HENT: Negative for rhinorrhea.   Respiratory: Negative for cough and shortness of breath.   Cardiovascular: Negative for chest pain.  Gastrointestinal: Negative for constipation and diarrhea.  Genitourinary: Negative for difficulty urinating.  Musculoskeletal: Negative for arthralgias and myalgias.  Skin: Negative for rash.  Allergic/Immunologic: Negative for environmental allergies.  Neurological: Positive for headaches. Negative for dizziness and numbness.  Psychiatric/Behavioral:       Denies concerns for anxiety or depression       Past Medical History:  Diagnosis Date  . Chicken pox   . Heart murmur 2011     Social History   Social History  . Marital status: Unknown    Spouse name: N/A  . Number of children: N/A  . Years of education: N/A   Occupational History  . Not on file.   Social History Main Topics  . Smoking status: Never Smoker  . Smokeless tobacco: Never Used  . Alcohol use No  . Drug use: No  . Sexual activity: Not on file   Other Topics Concern  . 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   Works as a Marine scientist.   Highest level of education is bachelors.   Works for [employer].   Enjoys traveling, reading, collecting coins.    Past Surgical History:  Procedure Laterality Date  . CHOLECYSTECTOMY  2012    Family History  Problem Relation Age of Onset  . Diabetes Mother   . Diabetes Father   . Heart disease Father 47    Myocardial  . Kidney disease Father   . Stroke Father   . Hypertension Father   . Colon cancer Neg Hx     No Known Allergies  Current Outpatient Prescriptions on File Prior to Visit  Medication Sig Dispense Refill  . MULTIPLE VITAMIN PO Take 1 tablet by mouth daily.      No current facility-administered medications on file prior to visit.     BP 116/78   Pulse 66   Temp 97.7 F (36.5 C) (Oral)   Ht 5\' 10"  (1.778 m)   Wt 257 lb 12.8 oz (116.9 kg)   SpO2 98%   BMI 36.99 kg/m    Objective:   Physical Exam  Constitutional: He is oriented to person, place, and time. He appears well-nourished.  HENT:  Right Ear: Tympanic membrane and ear canal normal.  Left Ear: Tympanic membrane and ear canal normal.  Nose: Nose normal. Right sinus exhibits no maxillary sinus tenderness and no frontal sinus tenderness. Left sinus exhibits no maxillary sinus tenderness and no frontal sinus tenderness.  Mouth/Throat: Oropharynx is clear  and moist.  Eyes: Conjunctivae and EOM are normal. Pupils are equal, round, and reactive to light.  Neck: Neck supple. Carotid bruit is not present. No thyromegaly present.  Cardiovascular: Normal rate, regular rhythm and normal heart sounds.   Pulmonary/Chest: Effort normal and breath sounds normal. He has no wheezes. He has no rales.  Abdominal: Soft. Bowel sounds are normal. There is no tenderness.  Musculoskeletal: Normal range of motion.  Neurological: He is alert and oriented to person, place, and time. He has normal reflexes. No cranial nerve deficit.  Skin: Skin is warm and dry.   Psychiatric: He has a normal mood and affect.          Assessment & Plan:

## 2016-03-22 NOTE — Assessment & Plan Note (Signed)
TC, LDL, and Trigs slightly above goal. Emphasized importance of healthy diet and routine exercise. Recheck in 6 moths.

## 2016-09-20 ENCOUNTER — Ambulatory Visit: Payer: Self-pay | Admitting: Primary Care

## 2016-09-27 ENCOUNTER — Ambulatory Visit: Payer: Managed Care, Other (non HMO) | Admitting: Primary Care

## 2016-10-11 ENCOUNTER — Ambulatory Visit (INDEPENDENT_AMBULATORY_CARE_PROVIDER_SITE_OTHER): Payer: Managed Care, Other (non HMO) | Admitting: Family Medicine

## 2016-10-11 ENCOUNTER — Encounter: Payer: Self-pay | Admitting: Family Medicine

## 2016-10-11 VITALS — BP 128/76 | HR 75 | Temp 98.2°F | Ht 70.0 in | Wt 245.2 lb

## 2016-10-11 DIAGNOSIS — H00025 Hordeolum internum left lower eyelid: Secondary | ICD-10-CM | POA: Insufficient documentation

## 2016-10-11 HISTORY — DX: Hordeolum internum left lower eyelid: H00.025

## 2016-10-11 MED ORDER — ERYTHROMYCIN 5 MG/GM OP OINT: 1.0000 "application " | TOPICAL_OINTMENT | Freq: Two times a day (BID) | OPHTHALMIC | 0 refills | Status: DC

## 2016-10-11 NOTE — Patient Instructions (Signed)
You do have a Stye. Treat with warm compresses and may use topical erythromycin ointment as well. Let us know if not improving over time 1-2 wks - and then we would refer you to eye doctor  Stye A stye is a bump on your eyelid caused by a bacterial infection. A stye can form inside the eyelid (internal stye) or outside the eyelid (external stye). An internal stye may be caused by an infected oil-producing gland inside your eyelid. An external stye may be caused by an infection at the base of your eyelash (hair follicle). Styes are very common. Anyone can get them at any age. They usually occur in just one eye, but you may have more than one in either eye. What are the causes? The infection is almost always caused by bacteria called Staphylococcus aureus. This is a common type of bacteria that lives on your skin. What increases the risk? You may be at higher risk for a stye if you have had one before. You may also be at higher risk if you have:  Diabetes.  Long-term illness.  Long-term eye redness.  A skin condition called seborrhea.  High fat levels in your blood (lipids). What are the signs or symptoms? Eyelid pain is the most common symptom of a stye. Internal styes are more painful than external styes. Other signs and symptoms may include:  Painful swelling of your eyelid.  A scratchy feeling in your eye.  Tearing and redness of your eye.  Pus draining from the stye. How is this diagnosed? Your health care provider may be able to diagnose a stye just by examining your eye. The health care provider may also check to make sure:  You do not have a fever or other signs of a more serious infection.  The infection has not spread to other parts of your eye or areas around your eye. How is this treated? Most styes will clear up in a few days without treatment. In some cases, you may need to use antibiotic drops or ointment to prevent infection. Your health care provider may have to  drain the stye surgically if your stye is:  Large.  Causing a lot of pain.  Interfering with your vision. This can be done using a thin blade or a needle. Follow these instructions at home:  Take medicines only as directed by your health care provider.  Apply a clean, warm compress to your eye for 10 minutes, 4 times a day.  Do not wear contact lenses or eye makeup until your stye has healed.  Do not try to pop or drain the stye. Contact a health care provider if:  You have chills or a fever.  Your stye does not go away after several days.  Your stye affects your vision.  Your eyeball becomes swollen, red, or painful. This information is not intended to replace advice given to you by your health care provider. Make sure you discuss any questions you have with your health care provider. Document Released: 02/21/2005 Document Revised: 01/08/2016 Document Reviewed: 08/28/2013 Elsevier Interactive Patient Education  2017 [employer].

## 2016-10-11 NOTE — Progress Notes (Signed)
   BP 128/76 (BP Location: Right Arm, Patient Position: Sitting, Cuff Size: Large)   Pulse 75   Temp 98.2 F (36.8 C) (Oral)   Ht 5\' 10"  (1.778 m)   Wt 245 lb 4 oz (111.2 kg)   SpO2 98%   BMI 35.19 kg/m    CC: "I think I have a left eye stye" Subjective:    Patient ID: Stephen Fiedler., male    DOB: 1960-06-12, 56 y.o.   MRN: [id number]  HPI: Stephen Ramos. is a 56 y.o. male presenting on 10/11/2016 for check both eyes   2 d ago noticed L lower eyelid swelling associated with eye irritation/discomfort and scratchy eye. Yesterday noticed some swelling at right. Treating with warm compresses which helped with discomfort.   No fevers/chills, vision changes, URI sxs of congestion, cough, rhinorrhea. No pain with eye movements.   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  Relevant past medical, surgical, family and social history reviewed and updated as indicated. Interim medical history since our last visit reviewed. Allergies and medications reviewed and updated. Outpatient Medications Prior to Visit  Medication Sig Dispense Refill  . MULTIPLE VITAMIN PO Take 1 tablet by mouth daily.      No facility-administered medications prior to visit.      Per HPI unless specifically indicated in ROS section below Review of Systems     Objective:    BP 128/76 (BP Location: Right Arm, Patient Position: Sitting, Cuff Size: Large)   Pulse 75   Temp 98.2 F (36.8 C) (Oral)   Ht 5\' 10"  (1.778 m)   Wt 245 lb 4 oz (111.2 kg)   SpO2 98%   BMI 35.19 kg/m   Wt Readings from Last 3 Encounters:  10/11/16 245 lb 4 oz (111.2 kg)  03/22/16 257 lb 12.8 oz (116.9 kg)  03/15/16 263 lb 1.9 oz (119.4 kg)    Physical Exam  Constitutional: He appears well-developed and well-nourished. No distress.  HENT:  Mouth/Throat: Oropharynx is clear and moist. No oropharyngeal exudate.  Eyes: EOM are normal. Pupils are equal, round, and  reactive to light.  Injected palpebral conjunctiva L>R Hordeolum present inner lower eyelid on left with mild surrounding erythema and edema  Nursing note and vitals reviewed.     Assessment & Plan:   Problem List Items Addressed This Visit    Hordeolum internum left lower eyelid - Primary    Supportive care reviewed - rec continue warm compresses 3-4 times daily. Given recent exposure to secretions - will treat with erythromycin ointment as well.  Update if not improving with treatment or ongoing past 1-2 wks for ophtho referral.           Follow up plan: Return if symptoms worsen or fail to improve.  Ria Bush, MD

## 2016-10-11 NOTE — Assessment & Plan Note (Signed)
Supportive care reviewed - rec continue warm compresses 3-4 times daily. Given recent exposure to secretions - will treat with erythromycin ointment as well.  Update if not improving with treatment or ongoing past 1-2 wks for ophtho referral.

## 2016-10-25 ENCOUNTER — Ambulatory Visit: Payer: Managed Care, Other (non HMO) | Admitting: Primary Care

## 2016-11-16 ENCOUNTER — Ambulatory Visit: Payer: Managed Care, Other (non HMO) | Admitting: Primary Care

## 2016-12-20 ENCOUNTER — Encounter: Payer: Self-pay | Admitting: Family Medicine

## 2016-12-20 ENCOUNTER — Encounter (INDEPENDENT_AMBULATORY_CARE_PROVIDER_SITE_OTHER): Payer: Self-pay

## 2016-12-20 ENCOUNTER — Ambulatory Visit (INDEPENDENT_AMBULATORY_CARE_PROVIDER_SITE_OTHER): Payer: Managed Care, Other (non HMO) | Admitting: Family Medicine

## 2016-12-20 DIAGNOSIS — H00025 Hordeolum internum left lower eyelid: Secondary | ICD-10-CM

## 2016-12-20 DIAGNOSIS — H029 Unspecified disorder of eyelid: Secondary | ICD-10-CM | POA: Diagnosis not present

## 2016-12-20 DIAGNOSIS — H578 Other specified disorders of eye and adnexa: Secondary | ICD-10-CM

## 2016-12-20 DIAGNOSIS — H5789 Other specified disorders of eye and adnexa: Secondary | ICD-10-CM

## 2016-12-20 NOTE — Assessment & Plan Note (Signed)
Likely due to eyelid lesion .Marland Kitchen No clear conjunctivitis, vision changes etc.

## 2016-12-20 NOTE — Patient Instructions (Signed)
Please stop at the front desk to set up referral.  

## 2016-12-20 NOTE — Progress Notes (Signed)
   Subjective:    Patient ID: Stephen Ramos., male    DOB: 1960/08/15, 56 y.o.   MRN: [id number]  HPI    56 year old male patient of Allie Bossier.. Presents for a  lesion on  left eye irritation, left eye sore.  He was seen by Dr. Darnell Level on 5/17 for hordeolum on left eyelid.  Treated with  erythromycin ointment twice daily and warm compresses 3-4 times daily. Was better some, stye never went away.   In last 3 days eye and eyelid is irritated and itching. Slight matting in AM.  No vision change, no redness of eye, no fever.  Has not seen eye MD recently. Has optomitrist to make glasses.   Review of Systems  Constitutional: Negative for fatigue.  HENT: Negative for ear pain.   Eyes: Positive for pain. Negative for photophobia, redness and visual disturbance.  Respiratory: Negative for cough and shortness of breath.   Cardiovascular: Negative for chest pain and leg swelling.       Objective:   Physical Exam  Constitutional: He appears well-developed and well-nourished. No distress.  HENT:  Mouth/Throat: Oropharynx is clear and moist. No oropharyngeal exudate.  Eyes: Pupils are equal, round, and reactive to light. EOM are normal.   NO injected conjunctiva lesion present inner lower eyelid on left with mild surrounding erythema and edema  Nursing note and vitals reviewed.         Assessment & Plan:

## 2016-12-20 NOTE — Assessment & Plan Note (Signed)
Likely hordeolum but given resistant to warm compresses and erythromycin oitnment  X 2 month... Refer to opthamologist to rule out other more concerning diagnosis and to consider I and D of lesion.

## 2017-01-24 ENCOUNTER — Ambulatory Visit: Payer: Managed Care, Other (non HMO) | Admitting: Primary Care

## 2017-03-15 ENCOUNTER — Encounter: Payer: Self-pay | Admitting: Primary Care

## 2017-03-15 ENCOUNTER — Ambulatory Visit (INDEPENDENT_AMBULATORY_CARE_PROVIDER_SITE_OTHER): Payer: Managed Care, Other (non HMO) | Admitting: Primary Care

## 2017-03-15 ENCOUNTER — Encounter (INDEPENDENT_AMBULATORY_CARE_PROVIDER_SITE_OTHER): Payer: Self-pay

## 2017-03-15 VITALS — BP 116/76 | HR 75 | Temp 97.7°F | Ht 70.0 in | Wt 251.4 lb

## 2017-03-15 DIAGNOSIS — R7303 Prediabetes: Secondary | ICD-10-CM

## 2017-03-15 DIAGNOSIS — E785 Hyperlipidemia, unspecified: Secondary | ICD-10-CM | POA: Diagnosis not present

## 2017-03-15 LAB — COMPREHENSIVE METABOLIC PANEL
ALK PHOS: 49 U/L (ref 39–117)
ALT: 53 U/L (ref 0–53)
AST: 26 U/L (ref 0–37)
Albumin: 4.3 g/dL (ref 3.5–5.2)
BUN: 18 mg/dL (ref 6–23)
CALCIUM: 9.9 mg/dL (ref 8.4–10.5)
CO2: 29 meq/L (ref 19–32)
Chloride: 103 mEq/L (ref 96–112)
Creatinine, Ser: 1.16 mg/dL (ref 0.40–1.50)
GFR: 69.72 mL/min (ref >60.00)
GLUCOSE: 107 mg/dL — AB (ref 70–99)
POTASSIUM: 4.6 meq/L (ref 3.5–5.1)
Sodium: 139 mEq/L (ref 135–145)
Total Bilirubin: 0.5 mg/dL (ref 0.2–1.2)
Total Protein: 7.5 g/dL (ref 6.0–8.3)

## 2017-03-15 LAB — LIPID PANEL
Cholesterol: 191 mg/dL (ref 0–200)
HDL: 37.5 mg/dL — AB (ref >39.00)
LDL CALC: 124 mg/dL — AB (ref 0–99)
NONHDL: 153.34
Total CHOL/HDL Ratio: 5
Triglycerides: 148 mg/dL (ref 0.0–149.0)
VLDL: 29.6 mg/dL (ref 0.0–40.0)

## 2017-03-15 LAB — HEMOGLOBIN A1C: HEMOGLOBIN A1C: 5.8 % (ref 4.6–6.5)

## 2017-03-15 NOTE — Patient Instructions (Signed)
Complete lab work prior to leaving today. I will notify you of your results once received.   Ear Pressure/Dizziness: Try using Flonase (fluticasone) nasal spray. Instill 1 spray in each nostril twice daily.   Consider trying an antihistamine such as Claritin for itchy ears.  It was a pleasure to see you today!

## 2017-03-15 NOTE — Assessment & Plan Note (Signed)
Due for repeat A1C today, labs pending. Overall fair diet, encouraged to increase exercise.

## 2017-03-15 NOTE — Progress Notes (Signed)
Subjective:    Patient ID: Stephen Ramos., male    DOB: 23-May-1961, 56 y.o.   MRN: [id number]  HPI  Mr. Raynaldo Falco is a 56 year old male who presents today for follow up of health conditions.  1) Prediabetes: A1C of 5.9 on labs from 02/2016. It was recommended he return for repeat labs 3-6 months later for which he did not. He is due for repeat labs today.  Diet currently consists of:  Breakfast: [place] sausage, egg whites, english muffin Lunch: Baked chicken, steamed vegetables, once weekly fried chicken Dinner: Steak, chicken, vegetables Snacks: Fruit Desserts: Special occasions. Beverages: Water, juice  Exercise: He does the elliptical 30 minutes three times weekly.   2) Hyperlipidemia: Lipid panel in 02/2016 with TC of 213, LDL of 143. He was encouraged to start exercising and improving his diet. It was also recommended he return for repeat labs 6 months later for which he did not. He is due for repeat labs today.  3) Ear Pain: Located to the bilateral ears. He has a bad habit of putting pens to the outer canal of his ears when at work. Also reporting feeling off balance, itchy ears. He also endorses intermittent nausea but has been through personal stress. He denies diarrhea, constipation, vomiting, abdominal pain.   Review of Systems  Constitutional: Negative for fever.  HENT: Negative for sore throat.        Ear itching  Eyes: Negative for visual disturbance.  Respiratory: Negative for shortness of breath.   Cardiovascular: Negative for chest pain.  Gastrointestinal: Positive for nausea. Negative for abdominal pain, constipation, diarrhea and vomiting.  Neurological:       Intermittently feeling off balance  Psychiatric/Behavioral:       Increased personal stress at home       Past Medical History:  Diagnosis Date  . Chicken pox   . Heart murmur 2011     Social History   Social History  . Marital status: Unknown    Spouse name: N/A  . Number of  children: N/A  . Years of education: N/A   Occupational History  . Not on file.   Social History Main Topics  . Smoking status: Never Smoker  . Smokeless tobacco: Never Used  . Alcohol use No  . Drug use: No  . Sexual activity: Not on file   Other Topics Concern  . 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   Works as a Marine scientist.   Highest level of education is bachelors.   Works for [employer].   Enjoys traveling, reading, collecting coins.    Past Surgical History:  Procedure Laterality Date  . CHOLECYSTECTOMY  2012    Family History  Problem Relation Age of Onset  . Diabetes Mother   . Diabetes Father   . Heart disease Father 51       Myocardial  . Kidney disease Father   . Stroke Father   . Hypertension Father   . Colon cancer Neg Hx     No Known Allergies  Current Outpatient Prescriptions on File Prior to Visit  Medication Sig Dispense Refill  . MULTIPLE VITAMIN PO Take 1 tablet by mouth every other day.      No current facility-administered medications on file prior to visit.     BP 116/76   Pulse 75   Temp 97.7 F (36.5 C) (Oral)   Ht 5\' 10"  (1.778 m)   Wt  251 lb 6.4 oz (114 kg)   SpO2 97%   BMI 36.07 kg/m    Objective:   Physical Exam  Constitutional: He is oriented to person, place, and time. He appears well-nourished.  HENT:  Right Ear: Tympanic membrane and ear canal normal.  Left Ear: Tympanic membrane and ear canal normal.  Nose: No mucosal edema. Right sinus exhibits no maxillary sinus tenderness and no frontal sinus tenderness. Left sinus exhibits no maxillary sinus tenderness and no frontal sinus tenderness.  Mouth/Throat: Oropharynx is clear and moist.  Eyes: Conjunctivae are normal.  Neck: Neck supple.  Cardiovascular: Normal rate and regular rhythm.   Pulmonary/Chest: Effort normal and breath sounds normal. He has no wheezes. He has no rales.  Abdominal: Soft. There is no tenderness.  Neurological: He is  alert and oriented to person, place, and time.  Skin: Skin is warm and dry.  Psychiatric: He has a normal mood and affect.          Assessment & Plan:  Otalgia:  Also with ear itching and feeling off balance. Exam today without evidence of infection, but more consistent for allergy involvement. Discussed use of Flonase, Claritin. He will update if no improvement. Nausea and feeling off balance could be from vertigo originating from the inner ear. Nausea could also be secondary to increased personal stress at home. Abdominal exam otherwise unremarkable.   Sheral Flow, NP

## 2017-03-15 NOTE — Assessment & Plan Note (Signed)
Due for repeat lipids today. Increase exercise, vegetables, whole grains.

## 2017-03-18 ENCOUNTER — Ambulatory Visit: Payer: Self-pay | Admitting: *Deleted

## 2017-03-18 NOTE — Telephone Encounter (Signed)
Open in error

## 2017-03-19 ENCOUNTER — Encounter: Payer: Self-pay | Admitting: *Deleted

## 2017-03-19 ENCOUNTER — Telehealth: Payer: Self-pay

## 2017-03-19 NOTE — Telephone Encounter (Signed)
Copied from [place] #[id number]. Topic: Quick Communication - Lab Results >> Mar 19, 2017  3:08 PM Burnis Medin, [place] wrote: 03/19/17  Pt. Called back about lab results

## 2017-03-21 ENCOUNTER — Ambulatory Visit (INDEPENDENT_AMBULATORY_CARE_PROVIDER_SITE_OTHER): Payer: Managed Care, Other (non HMO)

## 2017-03-21 DIAGNOSIS — Z23 Encounter for immunization: Secondary | ICD-10-CM | POA: Diagnosis not present

## 2017-03-28 ENCOUNTER — Ambulatory Visit: Payer: Managed Care, Other (non HMO) | Admitting: Primary Care

## 2017-04-05 ENCOUNTER — Telehealth: Payer: Self-pay

## 2017-04-05 DIAGNOSIS — H00025 Hordeolum internum left lower eyelid: Secondary | ICD-10-CM

## 2017-04-05 NOTE — Telephone Encounter (Signed)
Referral sent 

## 2017-04-05 NOTE — Telephone Encounter (Signed)
Copied from [place] #[id number]. Topic: Referral - Request >> Apr 05, 2017 12:53 PM Yvette Rack wrote: Reason for CRM: referral to Ophthamologist   >> Apr 05, 2017  1:03 PM Yvette Rack wrote: Patient is available Thursday and Friday mornings until 12 noon for the Opthamology referral for left eyelid >> Apr 05, 2017  1:06 PM Helene Shoe, LPN wrote: Per chart review tab pt seen 12/20/16 for left eyelid.Please advise.

## 2018-02-27 ENCOUNTER — Ambulatory Visit (INDEPENDENT_AMBULATORY_CARE_PROVIDER_SITE_OTHER): Payer: Managed Care, Other (non HMO)

## 2018-02-27 DIAGNOSIS — Z23 Encounter for immunization: Secondary | ICD-10-CM

## 2018-05-01 ENCOUNTER — Ambulatory Visit (INDEPENDENT_AMBULATORY_CARE_PROVIDER_SITE_OTHER): Payer: Managed Care, Other (non HMO) | Admitting: Primary Care

## 2018-05-01 ENCOUNTER — Encounter: Payer: Self-pay | Admitting: Primary Care

## 2018-05-01 VITALS — BP 116/78 | HR 78 | Temp 98.2°F | Ht 70.0 in | Wt 223.0 lb

## 2018-05-01 DIAGNOSIS — Z0000 Encounter for general adult medical examination without abnormal findings: Secondary | ICD-10-CM | POA: Diagnosis not present

## 2018-05-01 DIAGNOSIS — Z1159 Encounter for screening for other viral diseases: Secondary | ICD-10-CM

## 2018-05-01 DIAGNOSIS — E669 Obesity, unspecified: Secondary | ICD-10-CM | POA: Diagnosis not present

## 2018-05-01 DIAGNOSIS — Z23 Encounter for immunization: Secondary | ICD-10-CM | POA: Diagnosis not present

## 2018-05-01 DIAGNOSIS — Z125 Encounter for screening for malignant neoplasm of prostate: Secondary | ICD-10-CM | POA: Diagnosis not present

## 2018-05-01 DIAGNOSIS — E782 Mixed hyperlipidemia: Secondary | ICD-10-CM

## 2018-05-01 DIAGNOSIS — R7303 Prediabetes: Secondary | ICD-10-CM | POA: Diagnosis not present

## 2018-05-01 DIAGNOSIS — Z113 Encounter for screening for infections with a predominantly sexual mode of transmission: Secondary | ICD-10-CM

## 2018-05-01 LAB — PSA: PSA: 0.94 ng/mL (ref 0.10–4.00)

## 2018-05-01 LAB — COMPREHENSIVE METABOLIC PANEL
ALT: 57 U/L — ABNORMAL HIGH (ref 0–53)
AST: 28 U/L (ref 0–37)
Albumin: 4.8 g/dL (ref 3.5–5.2)
Alkaline Phosphatase: 46 U/L (ref 39–117)
BUN: 21 mg/dL (ref 6–23)
CO2: 27 mEq/L (ref 19–32)
Calcium: 10.6 mg/dL — ABNORMAL HIGH (ref 8.4–10.5)
Chloride: 103 mEq/L (ref 96–112)
Creatinine, Ser: 1.2 mg/dL (ref 0.40–1.50)
GFR: 66.76 mL/min (ref >60.00)
Glucose, Bld: 104 mg/dL — ABNORMAL HIGH (ref 70–99)
Potassium: 4.7 mEq/L (ref 3.5–5.1)
Sodium: 138 mEq/L (ref 135–145)
Total Bilirubin: 0.7 mg/dL (ref 0.2–1.2)
Total Protein: 7.9 g/dL (ref 6.0–8.3)

## 2018-05-01 LAB — LIPID PANEL
CHOLESTEROL: 217 mg/dL — AB (ref 0–200)
HDL: 50.5 mg/dL (ref >39.00)
LDL Cholesterol: 142 mg/dL — ABNORMAL HIGH (ref 0–99)
NonHDL: 166.84
TRIGLYCERIDES: 123 mg/dL (ref 0.0–149.0)
Total CHOL/HDL Ratio: 4
VLDL: 24.6 mg/dL (ref 0.0–40.0)

## 2018-05-01 LAB — HEMOGLOBIN A1C: Hgb A1c MFr Bld: 5.7 % (ref 4.6–6.5)

## 2018-05-01 NOTE — Assessment & Plan Note (Signed)
Weight loss of nearly 30 pounds since last visit, commended him on this success and encouraged to continue.

## 2018-05-01 NOTE — Patient Instructions (Signed)
Stop by the lab prior to leaving today. I will notify you of your results once received.   Continue exercising. You should be getting 150 minutes of moderate intensity exercise weekly.  Continue to work on a healthy diet, congratulations on your weight loss.  We will see you next year! It was a pleasure to see you today!   Preventive Care 40-64 Years, Male Preventive care refers to lifestyle choices and visits with your health care provider that can promote health and wellness. What does preventive care include?  A yearly physical exam. This is also called an annual well check.  Dental exams once or twice a year.  Routine eye exams. Ask your health care provider how often you should have your eyes checked.  Personal lifestyle choices, including: ? Daily care of your teeth and gums. ? Regular physical activity. ? Eating a healthy diet. ? Avoiding tobacco and drug use. ? Limiting alcohol use. ? Practicing safe sex. ? Taking low-dose aspirin every day starting at age 24. What happens during an annual well check? The services and screenings done by your health care provider during your annual well check will depend on your age, overall health, lifestyle risk factors, and family history of disease. Counseling Your health care provider may ask you questions about your:  Alcohol use.  Tobacco use.  Drug use.  Emotional well-being.  Home and relationship well-being.  Sexual activity.  Eating habits.  Work and work Statistician.  Screening You may have the following tests or measurements:  Height, weight, and BMI.  Blood pressure.  Lipid and cholesterol levels. These may be checked every 5 years, or more frequently if you are over 52 years old.  Skin check.  Lung cancer screening. You may have this screening every year starting at age 58 if you have a 30-pack-year history of smoking and currently smoke or have quit within the past 15 years.  Fecal occult blood test  (FOBT) of the stool. You may have this test every year starting at age 64.  Flexible sigmoidoscopy or colonoscopy. You may have a sigmoidoscopy every 5 years or a colonoscopy every 10 years starting at age 36.  Prostate cancer screening. Recommendations will vary depending on your family history and other risks.  Hepatitis C blood test.  Hepatitis B blood test.  Sexually transmitted disease (STD) testing.  Diabetes screening. This is done by checking your blood sugar (glucose) after you have not eaten for a while (fasting). You may have this done every 1-3 years.  Discuss your test results, treatment options, and if necessary, the need for more tests with your health care provider. Vaccines Your health care provider may recommend certain vaccines, such as:  Influenza vaccine. This is recommended every year.  Tetanus, diphtheria, and acellular pertussis (Tdap, Td) vaccine. You may need a Td booster every 10 years.  Varicella vaccine. You may need this if you have not been vaccinated.  Zoster vaccine. You may need this after age 62.  Measles, mumps, and rubella (MMR) vaccine. You may need at least one dose of MMR if you were born in 1957 or later. You may also need a second dose.  Pneumococcal 13-valent conjugate (PCV13) vaccine. You may need this if you have certain conditions and have not been vaccinated.  Pneumococcal polysaccharide (PPSV23) vaccine. You may need one or two doses if you smoke cigarettes or if you have certain conditions.  Meningococcal vaccine. You may need this if you have certain conditions.  Hepatitis A  vaccine. You may need this if you have certain conditions or if you travel or work in places where you may be exposed to hepatitis A.  Hepatitis B vaccine. You may need this if you have certain conditions or if you travel or work in places where you may be exposed to hepatitis B.  Haemophilus influenzae type b (Hib) vaccine. You may need this if you have  certain risk factors.  Talk to your health care provider about which screenings and vaccines you need and how often you need them. This information is not intended to replace advice given to you by your health care provider. Make sure you discuss any questions you have with your health care provider. Document Released: 06/10/2015 Document Revised: 02/01/2016 Document Reviewed: 03/15/2015 Elsevier Interactive Patient Education  [employer].

## 2018-05-01 NOTE — Addendum Note (Signed)
Addended by: Jacqualin Combes on: 05/01/2018 01:20 PM   Modules accepted: Orders

## 2018-05-01 NOTE — Assessment & Plan Note (Signed)
Repeat A1C pending. Commended him on a healthy diet and regular exercise.

## 2018-05-01 NOTE — Progress Notes (Signed)
Subjective:    Patient ID: Stephen Fiedler., male    DOB: 03-Dec-1960, 57 y.o.   MRN: [id number]  HPI  [name] is a 57 year old male who presents today for complete physical.  Immunizations: -Tetanus: Completed in 2009 -Influenza: Completed this season    Diet: He endorses a healthy diet Breakfast: [place] sausages, eggs Lunch: Baked chicken wings, vegetables, salad Dinner: Meat, vegetables, salad Snacks: Fruit Desserts: None Beverages: Water  Exercise: He is working out at [employer] 3 days weekly for one hour at a time Eye exam: Completed in 2018 Dental exam: Completes semi-annually  Colonoscopy: Completed in 2016, due in 2026 PSA: Hep C Screen:  Wt Readings from Last 3 Encounters:  05/01/18 223 lb (101.2 kg)  03/15/17 251 lb 6.4 oz (114 kg)  12/20/16 252 lb 4 oz (114.4 kg)   BP Readings from Last 3 Encounters:  05/01/18 116/78  03/15/17 116/76  12/20/16 92/60     Review of Systems  Constitutional: Negative for unexpected weight change.  HENT: Negative for rhinorrhea.   Respiratory: Negative for cough and shortness of breath.   Cardiovascular: Negative for chest pain.  Gastrointestinal: Negative for constipation and diarrhea.  Genitourinary: Negative for difficulty urinating.  Musculoskeletal: Negative for arthralgias.  Skin: Negative for rash.  Allergic/Immunologic: Negative for environmental allergies.  Neurological: Negative for dizziness, numbness and headaches.       Intermittent vertigo   Psychiatric/Behavioral: The patient is not nervous/anxious.        Past Medical History:  Diagnosis Date  . Chicken pox   . Heart murmur 2011  . Prediabetes   . Vertigo      Social History   Socioeconomic History  . Marital status: Unknown    Spouse name: Not on file  . Number of children: Not on file  . Years of education: Not on file  . Highest education level: Not on file  Occupational History  . Not on file  Social Needs  . Financial  resource strain: Not on file  . Food insecurity:    Worry: Not on file    Inability: Not on file  . Transportation needs:    Medical: Not on file    Non-medical: Not on file  Tobacco Use  . Smoking status: Never Smoker  . Smokeless tobacco: Never Used  Substance and Sexual Activity  . Alcohol use: No    Alcohol/week: 0.0 standard drinks  . Drug use: No  . Sexual activity: Not on file  Lifestyle  . Physical activity:    Days per week: Not on file    Minutes per session: Not on file  . Stress: Not on file  Relationships  . Social connections:    Talks on phone: Not on file    Gets together: Not on file    Attends religious service: Not on file    Active member of club or organization: Not on file    Attends meetings of clubs or organizations: Not on file    Relationship status: Not on file  . Intimate partner violence:    Fear of current or ex partner: Not on file    Emotionally abused: Not on file    Physically abused: Not on file    Forced sexual activity: Not on file  Other Topics Concern  . 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   Works as a Marine scientist.   Highest level of education is  bachelors.   Works for [employer].   Enjoys traveling, reading, collecting coins.    Past Surgical History:  Procedure Laterality Date  . CHOLECYSTECTOMY  2012    Family History  Problem Relation Age of Onset  . Diabetes Mother   . Diabetes Father   . Heart disease Father 74       Myocardial  . Kidney disease Father   . Stroke Father   . Hypertension Father   . Colon cancer Neg Hx     No Known Allergies  Current Outpatient Medications on File Prior to Visit  Medication Sig Dispense Refill  . MULTIPLE VITAMIN PO Take 1 tablet by mouth every other day.      No current facility-administered medications on file prior to visit.     BP 116/78   Pulse 78   Temp 98.2 F (36.8 C) (Oral)   Ht 5\' 10"  (1.778 m)   Wt 223 lb (101.2 kg)   SpO2 98%    BMI 32.00 kg/m    Objective:   Physical Exam  Constitutional: He is oriented to person, place, and time. He appears well-nourished.  HENT:  Mouth/Throat: No oropharyngeal exudate.  Eyes: Pupils are equal, round, and reactive to light. EOM are normal.  Neck: Neck supple. No thyromegaly present.  Cardiovascular: Normal rate and regular rhythm.  Respiratory: Effort normal and breath sounds normal.  GI: Soft. Bowel sounds are normal. There is no tenderness.  Musculoskeletal: Normal range of motion.  Neurological: He is alert and oriented to person, place, and time.  Skin: Skin is warm and dry.  Psychiatric: He has a normal mood and affect.           Assessment & Plan:

## 2018-05-01 NOTE — Assessment & Plan Note (Signed)
Repeat lipids pending. Commended him on his weight loss through diet and exercise.

## 2018-05-01 NOTE — Assessment & Plan Note (Signed)
Tetanus due and provided today. PSA pending. Colonoscopy UTD, due in 2026. Commended him on weight loss efforts and encouraged to continue. Exam unremarkable. Labs pending. Follow up in 1 year for CPE

## 2018-05-04 LAB — RPR: RPR Ser Ql: NONREACTIVE

## 2018-05-04 LAB — TRICHOMONAS VAGINALIS RNA, QL,MALES: Trichomonas vaginalis RNA: NOT DETECTED

## 2018-05-04 LAB — C. TRACHOMATIS/N. GONORRHOEAE RNA
C. trachomatis RNA, TMA: NOT DETECTED
N. gonorrhoeae RNA, TMA: NOT DETECTED

## 2018-05-04 LAB — HSV(HERPES SIMPLEX VRS) I + II AB-IGG: HAV 1 IGG,TYPE SPECIFIC AB: <0.9 index

## 2018-05-04 LAB — HEPATITIS C ANTIBODY
Hepatitis C Ab: NONREACTIVE
SIGNAL TO CUT-OFF: 0.02 (ref <1.00)

## 2018-05-04 LAB — HSV 1/2 AB (IGM), IFA W/RFLX TITER
HSV 1 IgM Screen: NEGATIVE
HSV 2 IgM Screen: NEGATIVE

## 2018-05-04 LAB — HIV ANTIBODY (ROUTINE TESTING W REFLEX): HIV 1&2 Ab, 4th Generation: NONREACTIVE

## 2018-05-05 ENCOUNTER — Encounter: Payer: Self-pay | Admitting: *Deleted

## 2018-05-06 ENCOUNTER — Encounter: Payer: Self-pay | Admitting: *Deleted

## 2018-05-12 ENCOUNTER — Telehealth: Payer: Self-pay | Admitting: *Deleted

## 2018-05-12 NOTE — Telephone Encounter (Signed)
Spoke to pt who states he was recently seen and given a TD immunization. 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 I advised pt we would have to consult PCP and contact him back to schedule; he is wanting to get it this Thursday 12/19, if possible.

## 2018-05-12 NOTE — Telephone Encounter (Signed)
Will you look up patient on [facility] to verify tdap history?

## 2018-05-14 NOTE — Telephone Encounter (Signed)
Spoke with patient via phone on 05/13/18, explained that since he had a tdap in 2010, he didn't need another, only a TD which was provided during his most recent visit. Reviewed guidelines for tetanus vaccination. He verbalized understanding.

## 2019-07-24 ENCOUNTER — Encounter: Payer: Managed Care, Other (non HMO) | Admitting: Primary Care

## 2019-08-06 ENCOUNTER — Encounter: Payer: Managed Care, Other (non HMO) | Admitting: Primary Care

## 2019-09-08 ENCOUNTER — Telehealth: Payer: Self-pay

## 2019-09-08 NOTE — Telephone Encounter (Signed)
[place] Night - Client Nonclinical Telephone Record AccessNurse Client [facility] Night - Client Client Site [place] - Night Physician [place] - PHYSICIAN, NOT LISTED- MD Contact Type Call Who Is Calling Patient / Member / Family / Caregiver Caller Name [place] Phone Number [phone] Patient Name Stephen Ramos Patient DOB 1961/03/30 Call Type Message Only Information Provided Reason for Call Request to Reschedule Office Appointment Initial Comment Caller states he has an appt next Friday, back is hurting more, and needing to reschedule for sooner if possible, asking for a call back. PA: Anda Kraft. Refused triage. This Thursday or Friday. Additional Comment Provided information for a call back from the office. Disp. Time Disposition Final User 09/08/2019 7:48:10 AM General Information Provided Yes Rosana Fret Call Closed By: Rosana Fret Transaction Date/Time: 09/08/2019 7:45:35 AM (ET)

## 2019-09-08 NOTE — Telephone Encounter (Signed)
Noted, will evaluate. 

## 2019-09-08 NOTE — Telephone Encounter (Signed)
Per pts appt desk appt has already been changed to 09/11/19 at 7:40 with Gentry Fitz NP.

## 2019-09-11 ENCOUNTER — Encounter: Payer: Self-pay | Admitting: Primary Care

## 2019-09-11 ENCOUNTER — Other Ambulatory Visit: Payer: Self-pay

## 2019-09-11 ENCOUNTER — Ambulatory Visit (INDEPENDENT_AMBULATORY_CARE_PROVIDER_SITE_OTHER): Payer: No Typology Code available for payment source | Admitting: Primary Care

## 2019-09-11 DIAGNOSIS — M545 Low back pain, unspecified: Secondary | ICD-10-CM

## 2019-09-11 DIAGNOSIS — M549 Dorsalgia, unspecified: Secondary | ICD-10-CM | POA: Insufficient documentation

## 2019-09-11 MED ORDER — MELOXICAM 15 MG PO TABS: 15.0000 mg | ORAL_TABLET | Freq: Every day | ORAL | 0 refills | Status: DC

## 2019-09-11 NOTE — Progress Notes (Signed)
Subjective:    Patient ID: Stephen Fiedler., male    DOB: 1960/05/29, 59 y.o.   MRN: [id number]  HPI  This visit occurred during the SARS-CoV-2 public health emergency.  Safety protocols were in place, including screening questions prior to the visit, additional usage of staff PPE, and extensive cleaning of exam room while observing appropriate contact time as indicated for disinfecting solutions.   [name] is a 59 year old male with a history of hyperlipidemia, prediabetes, obesity who presents today with a chief complaint of back pain.  His pain is located to the mid lower back that began 8 days ago when waking for work. He describes his pain "like a pinched nerve". He noticed having difficulty working out in the gym due to discomfort. He has been working overtime at work since March 2020, 12 hour shift five to six days weekly. 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   He took off three days of work this week and has noticed significant improvement. He's also taking Advil with improvement. He denies radiation of pain down lower extremities, numbness/tinlging, weakness, loss of bowel/bladder control, trauma.   Review of Systems  Genitourinary:       Denies loss of bowel/bladder control  Musculoskeletal: Positive for back pain.  Neurological: Negative for weakness and numbness.       Past Medical History:  Diagnosis Date  . Chicken pox   . Heart murmur 2011  . Prediabetes   . Vertigo      Social History   Socioeconomic History  . Marital status: Unknown    Spouse name: Not on file  . Number of children: Not on file  . Years of education: Not on file  . Highest education level: Not on file  Occupational History  . Not on file  Tobacco Use  . Smoking status: Never Smoker  . Smokeless tobacco: Never Used  Substance and Sexual Activity  . Alcohol use: No    Alcohol/week: 0.0 standard drinks  . Drug  use: No  . Sexual activity: Not on file  Other Topics Concern  . 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   Works as a Marine scientist.   Highest level of education is bachelors.   Works for [employer].   Enjoys traveling, reading, collecting coins.   Social Determinants of Health   Financial Resource Strain:   . Difficulty of Paying Living Expenses:   Food Insecurity:   . Worried About Charity fundraiser in the Last Year:   . Arboriculturist in the Last Year:   Transportation Needs:   . Film/video editor (Medical):   Marland Kitchen Lack of Transportation (Non-Medical):   Physical Activity:   . Days of Exercise per Week:   . Minutes of Exercise per Session:   Stress:   . Feeling of Stress :   Social Connections:   . Frequency of Communication with Friends and Family:   . Frequency of Social Gatherings with Friends and Family:   . Attends Religious Services:   . Active Member of Clubs or Organizations:   . Attends Archivist Meetings:   Marland Kitchen Marital Status:   Intimate Partner Violence:   . Fear of Current or Ex-Partner:   . Emotionally Abused:   Marland Kitchen Physically Abused:   . Sexually Abused:     Past Surgical History:  Procedure Laterality Date  . CHOLECYSTECTOMY  2012    Family History  Problem Relation Age of Onset  . Diabetes Mother   . Diabetes Father   . Heart disease Father 16       Myocardial  . Kidney disease Father   . Stroke Father   . Hypertension Father   . Colon cancer Neg Hx     No Known Allergies  Current Outpatient Medications on File Prior to Visit  Medication Sig Dispense Refill  . MULTIPLE VITAMIN PO Take 1 tablet by mouth every other day.      No current facility-administered medications on file prior to visit.    BP 124/74   Pulse 82   Temp (!) 96.1 F (35.6 C) (Temporal)   Ht 5\' 10"  (1.778 m)   Wt 222 lb 8 oz (100.9 kg)   SpO2 98%   BMI 31.93 kg/m    Objective:   Physical Exam  Constitutional: He appears  well-nourished.  Respiratory: Effort normal.  Musculoskeletal:     Lumbar back: Pain present. No tenderness. Normal range of motion.       Back:     Comments: Negative straight leg raise bilaterally            Assessment & Plan:

## 2019-09-11 NOTE — Patient Instructions (Signed)
You may take Meloxicam once daily as needed for pain. Do not take Advil, Motrin, or Aleve with this medication.  Stretch twice daily. Use Ice or heat.  Continue to use the back brace.  It was a pleasure to see you today!

## 2019-09-11 NOTE — Assessment & Plan Note (Signed)
Suspect lumbar strain from work. He has improved significantly since taking a few days off from work.  Discussed to work on better body mechanics when lifting, continue to wear back brace. Heat/Ice, stretching.   Rx for Meloxicam sent to pharmacy. He will update. No alarm signs.

## 2019-09-18 ENCOUNTER — Ambulatory Visit: Payer: Self-pay | Admitting: Primary Care

## 2019-10-04 ENCOUNTER — Other Ambulatory Visit: Payer: Self-pay | Admitting: Primary Care

## 2019-10-04 DIAGNOSIS — M545 Low back pain, unspecified: Secondary | ICD-10-CM

## 2019-10-05 NOTE — Telephone Encounter (Signed)
Last prescribed on 09/11/2019 . Last OV on 4/16/20021. Next future OV on 10/15/2019 with Dr Lorelei Pont

## 2019-10-07 ENCOUNTER — Ambulatory Visit: Payer: No Typology Code available for payment source | Admitting: Family Medicine

## 2019-10-15 ENCOUNTER — Other Ambulatory Visit: Payer: Self-pay

## 2019-10-15 ENCOUNTER — Ambulatory Visit (INDEPENDENT_AMBULATORY_CARE_PROVIDER_SITE_OTHER): Payer: No Typology Code available for payment source | Admitting: Family Medicine

## 2019-10-15 ENCOUNTER — Ambulatory Visit: Payer: No Typology Code available for payment source | Admitting: Family Medicine

## 2019-10-15 ENCOUNTER — Encounter: Payer: Self-pay | Admitting: Family Medicine

## 2019-10-15 VITALS — BP 110/72 | HR 73 | Temp 98.2°F | Ht 70.0 in | Wt 232.0 lb

## 2019-10-15 DIAGNOSIS — M549 Dorsalgia, unspecified: Secondary | ICD-10-CM | POA: Diagnosis not present

## 2019-10-15 NOTE — Progress Notes (Signed)
Lorena Benham T. Shawnn Bouillon, MD, [facility] [place] [facility] [place], [address]  Phone: [phone]  FAX: [phone]  Stephen Bapst. - 59 y.o. male  MRN [id number]  Date of Birth: 11/02/1960  Date: 10/15/2019  PCP: Pleas Koch, NP  Referral: Pleas Koch, NP  Chief Complaint  Patient presents with  . Back Pain    This visit occurred during the SARS-CoV-2 public health emergency.  Safety protocols were in place, including screening questions prior to the visit, additional usage of staff PPE, and extensive cleaning of exam room while observing appropriate contact time as indicated for disinfecting solutions.   Subjective:   Stephen Stolzenburg. is a 59 y.o. very pleasant male patient with Body mass index is 33.29 kg/m. who presents with the following:  He is a pleasant 59 year old gentleman he presented today with some ongoing back pain.  He saw my partner Mrs. Carlis Abbott on September 11, 2019.  At that point he was not having any radicular symptoms, and ultimately was placed on some meloxicam and handled this quite well.  Working 90 or seventy hours a week in the last year.  Now doing some peds.  About four or five weeks ago, turned to pull something in his back.  The next morning he went to get up.  Had to call out of work for a day.  Happened right away and then felt something and he kept on going. That morning when he got up severe pain.  After a week or go will sit up in the morning slowly.  Throughout the day, but in the past few days it has gotten better.   At this point, he is feeling quite a bit better, but he still has some lingering pain and is not been able to return to doing squats like he normally would do.  Wt Readings from Last 3 Encounters:  10/15/19 232 lb (105.2 kg)  09/11/19 222 lb 8 oz (100.9 kg)  05/01/18 223 lb (101.2 kg)      Review of Systems is noted in  the HPI, as appropriate   Objective:   BP 110/72   Pulse 73   Temp 98.2 F (36.8 C) (Temporal)   Ht 5\' 10"  (1.778 m)   Wt 232 lb (105.2 kg)   SpO2 98%   BMI 33.29 kg/m    GEN: No acute distress; alert,appropriate. PULM: Breathing comfortably in no respiratory distress PSYCH: Normally interactive.   Range of motion at  the waist: Flexion: normal Extension: normal Lateral bending: normal Rotation: all normal  No echymosis or edema Rises to examination table with no difficulty Gait: non antalgic  Inspection/Deformity: N Paraspinus Tenderness: Minimal  B Ankle Dorsiflexion (L5,4): 5/5 B Great Toe Dorsiflexion (L5,4): 5/5 Heel Walk (L5): WNL Toe Walk (S1): WNL Rise/Squat (L4): WNL  SENSORY B Medial Foot (L4): WNL B Dorsum (L5): WNL B Lateral (S1): WNL Light Touch: WNL Pinprick: WNL  REFLEXES Knee (L4): 2+ Ankle (S1): 2+  B SLR, seated: neg B SLR, supine: neg B FABER: neg B Reverse FABER: neg B Greater Troch: NT B Log Roll: neg B Stork: NT B Sciatic Notch: NT  Radiology: No results found.  Assessment and Plan:     ICD-10-CM   1. Acute back pain, unspecified back location, unspecified back pain laterality  M54.9    Total encounter time: 30 minutes. On the day of the patient encounter,  this can include review of prior records, labs, and imaging.  Additional time can include counselling, consultation with peer MD in person or by telephone.  This also includes independent review of Radiology.  Chart review, anatomy explanation.  Classic muscle strain, lumbar spine.  Improving already.  No sign of neurological changes.  I reviewed with the patient the structures involved and how they related to their diagnosis.   Conservative algorithms for acute back pain generally begin with the following: NSAIDS, Muscle Relaxants, Tylenol -none of these has helped him on a much.  Start with medications, core rehab, and progress from there following low back pain  algorithm.  No red flags are present.  A rehabilitation program from the [employer] was reviewed with the patient face to face for their condition.  I also gave him some piriformis stretches.  I think that he will do quite well.  Follow-up: No follow-ups on file.  No orders of the defined types were placed in this encounter.  There are no discontinued medications. No orders of the defined types were placed in this encounter.   Signed,  Stephen Deed. Tonee Silverstein, MD   Outpatient Encounter Medications as of 10/15/2019  Medication Sig  . meloxicam (MOBIC) 15 MG tablet TAKE 1 TABLET (15 MG TOTAL) BY MOUTH DAILY. AS NEEDED FOR PAIN.  . MULTIPLE VITAMIN PO Take 1 tablet by mouth every other day.    No facility-administered encounter medications on file as of 10/15/2019.

## 2019-10-22 ENCOUNTER — Encounter: Payer: Self-pay | Admitting: Primary Care

## 2019-11-08 ENCOUNTER — Other Ambulatory Visit: Payer: Self-pay | Admitting: Family Medicine

## 2019-11-08 DIAGNOSIS — M545 Low back pain, unspecified: Secondary | ICD-10-CM

## 2019-11-09 NOTE — Telephone Encounter (Signed)
Last refilled 10/05/19 for #30 with 0 refills  Last O/v on 10/15/19 with Dr. Lorelei Pont. Upcoming appt on 11/20/19. Ok to refill?

## 2019-11-09 NOTE — Telephone Encounter (Signed)
Refills sent to pharmacy. 

## 2019-11-20 ENCOUNTER — Encounter: Payer: Self-pay | Admitting: Primary Care

## 2019-12-25 ENCOUNTER — Encounter: Payer: Self-pay | Admitting: Primary Care

## 2020-02-19 ENCOUNTER — Encounter: Payer: Self-pay | Admitting: Primary Care

## 2020-03-30 ENCOUNTER — Encounter: Payer: Self-pay | Admitting: Primary Care

## 2020-05-04 ENCOUNTER — Encounter: Payer: Self-pay | Admitting: Primary Care

## 2020-05-11 ENCOUNTER — Encounter: Payer: Self-pay | Admitting: Primary Care

## 2020-05-11 ENCOUNTER — Other Ambulatory Visit: Payer: Self-pay

## 2020-05-11 ENCOUNTER — Ambulatory Visit (INDEPENDENT_AMBULATORY_CARE_PROVIDER_SITE_OTHER): Payer: No Typology Code available for payment source | Admitting: Primary Care

## 2020-05-11 DIAGNOSIS — B070 Plantar wart: Secondary | ICD-10-CM

## 2020-05-11 HISTORY — DX: Plantar wart: B07.0

## 2020-05-11 NOTE — Patient Instructions (Signed)
Purchase a wart remover treatment at the store as discussed.  It was a pleasure to see you today!   Plantar Warts Warts are small growths on the skin. When they occur on the underside (sole) of the foot, they are called plantar warts. Plantar warts often occur in groups, with several small warts around a larger wart. They tend to develop on the heel or the ball of the foot. They may grow into the deeper layers of skin or rise above the surface of the skin. Most warts are not painful, and they usually do not cause problems. However, plantar warts may cause pain when you walk because pressure is applied to them. Plantar warts may spread to other areas of the sole. They can also spread to other areas of the body through direct and indirect contact. Warts often go away on their own in time. Various treatments may be done if needed or desired. What are the causes? Plantar warts are caused by a type of virus that is called human papillomavirus (HPV).  Walking barefoot can cause exposure to the virus, especially if your feet are wet.  HPV attacks a break in the skin of the foot. What increases the risk? You are more likely to develop this condition if you:  Are between 56-63 years of age.  Use public showers or locker rooms.  Have a weakened body defense system (immune system). What are the signs or symptoms? Common symptoms of this condition include:  Flat or slightly raised growths that have a rough surface and look similar to a callus.  Pain when you use your foot to support your body weight. How is this diagnosed? A plantar wart can usually be diagnosed from its appearance. In some cases, a tissue sample may be removed (biopsy) to be looked at under a microscope. How is this treated? In many cases, warts do not need treatment. Without treatment, they often go away with time. If treatment is needed or desired, options may include:  Applying medicated solutions, creams, or patches to the  wart. These may be over-the-counter or prescription medicines that make the skin soft so that layers will gradually shed away. In many cases, the medicine is applied one or two times a day and covered with a bandage.  Freezing the wart with liquid nitrogen (cryotherapy).  Burning the wart with: ? Laser treatment. ? An electrified probe (electrocautery).  Injecting a medicine (Candida antigen) into the wart to help the body's immune system fight off the wart.  Having surgery to remove the wart.  Putting duct tape over the top of the wart (occlusion). You will leave the tape in place for as long as told by your health care provider, and then you will replace it with a new strip of tape. This is done until the wart goes away. Repeat treatment may be needed if you choose to remove warts. Warts sometimes go away and come back again. Follow these instructions at home:  Apply medicated creams or solutions only as told by your health care provider. This may involve: ? Soaking the affected area in warm water. ? Removing the top layer of softened skin before you apply the medicine. A pumice stone works well for removing the skin. ? Applying a bandage over the affected area after you apply the medicine. ? Repeating the process daily or as told by your health care provider.  Do not scratch or pick at a wart.  Wash your hands after you touch a wart.  If a wart is painful, try covering it with a bandage that has a hole in the middle. This helps to take pressure off the wart.  Keep all follow-up visits as told by your health care provider. This is important. How is this prevented? Take these actions to help prevent warts:  Wear shoes and socks. Change your socks daily.  Keep your feet clean and dry.  Do not walk barefoot in shared locker rooms, shower areas, or swimming pools.  Check your feet regularly.  Avoid direct contact with warts on other people. Contact a health care provider  if:  Your warts do not improve after treatment.  You have redness, swelling, or pain at the site of a wart.  You have bleeding from a wart that does not stop with light pressure.  You have diabetes and you develop a wart. Summary  Warts are small growths on the skin. When they occur on the underside (sole) of the foot, they are called plantar warts.  In many cases, warts do not need treatment. Without treatment, they often go away with time.  Apply medicated creams or solutions only as told by your health care provider.  Do not scratch or pick at a wart. Wash your hands after you touch a wart.  Keep all follow-up visits as told by your health care provider. This is important. This information is not intended to replace advice given to you by your health care provider. Make sure you discuss any questions you have with your health care provider. Document Revised: 12/10/2017 Document Reviewed: 12/10/2017 Elsevier Patient Education  Uncertain.

## 2020-05-11 NOTE — Assessment & Plan Note (Signed)
Evident to right plantar foot, has likely been present for much longer than two weeks.  Patient verbally consented to cryotherapy which was completed in the office today. Tolerated well.  Discussed OTC treatment for warts.  He will return in about one month for evaluation.

## 2020-05-11 NOTE — Progress Notes (Signed)
Subjective:    Patient ID: Stephen Ramos., male    DOB: 17-Aug-1960, 59 y.o.   MRN: [id number]  HPI  This visit occurred during the SARS-CoV-2 public health emergency.  Safety protocols were in place, including screening questions prior to the visit, additional usage of staff PPE, and extensive cleaning of exam room while observing appropriate contact time as indicated for disinfecting solutions.   [name] is a 59 year old male with a history of prediabetes, hyperlipidemia who presents today with a chief complaint of blister.  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  He's tried OTC callous remover pads with some improvement. His blister is painful, improved now. He denies redness, warmth, drainage. He denies a history of type 2 diabetes.  Review of Systems  Constitutional: Negative for fever.  Musculoskeletal: Negative for joint swelling.  Skin: Negative for color change.       "blister"  Neurological: Negative for numbness.       Past Medical History:  Diagnosis Date  . Chicken pox   . Heart murmur 2011  . Prediabetes   . Vertigo      Social History   Socioeconomic History  . Marital status: Single    Spouse name: Not on file  . Number of children: Not on file  . Years of education: Not on file  . Highest education level: Not on file  Occupational History  . Not on file  Tobacco Use  . Smoking status: Never Smoker  . Smokeless tobacco: Never Used  Substance and Sexual Activity  . Alcohol use: No    Alcohol/week: 0.0 standard drinks  . Drug use: No  . Sexual activity: Not on file  Other Topics Concern  . 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   Works as a Marine scientist.   Highest level of education is bachelors.   Works for [employer].   Enjoys traveling, reading, collecting coins.   Social Determinants of Health    Financial Resource Strain: Not on file  Food Insecurity: Not on file  Transportation Needs: Not on file  Physical Activity: Not on file  Stress: Not on file  Social Connections: Not on file  Intimate Partner Violence: Not on file    Past Surgical History:  Procedure Laterality Date  . CHOLECYSTECTOMY  2012    Family History  Problem Relation Age of Onset  . Diabetes Mother   . Diabetes Father   . Heart disease Father 55       Myocardial  . Kidney disease Father   . Stroke Father   . Hypertension Father   . Colon cancer Neg Hx     No Known Allergies  Current Outpatient Medications on File Prior to Visit  Medication Sig Dispense Refill  . MULTIPLE VITAMIN PO Take 1 tablet by mouth every other day.     . meloxicam (MOBIC) 15 MG tablet TAKE 1 TABLET BY MOUTH EVERY DAY AS NEEDED FOR PAIN 30 tablet 0   No current facility-administered medications on file prior to visit.    BP 110/82   Pulse 68   Temp 98.6 F (37 C) (Temporal)   Ht 5\' 10"  (1.778 m)   Wt 235 lb (106.6 kg)   SpO2 100%   BMI 33.72 kg/m    Objective:   Physical Exam  Pulmonary:     Effort: Pulmonary effort is normal.  Skin:    General: Skin is warm and dry.     Findings: No erythema.     Comments: Plantar wart to right plantar foot, ball of foot, lateral side. No erythema or drainage. Thickened skin. Tender with palpation.   Neurological:     Mental Status: He is alert.            Assessment & Plan:

## 2020-06-09 ENCOUNTER — Ambulatory Visit: Payer: No Typology Code available for payment source | Admitting: Primary Care

## 2020-06-23 ENCOUNTER — Encounter: Payer: Self-pay | Admitting: Primary Care

## 2020-06-24 ENCOUNTER — Ambulatory Visit (INDEPENDENT_AMBULATORY_CARE_PROVIDER_SITE_OTHER)
Admission: RE | Admit: 2020-06-24 | Discharge: 2020-06-24 | Disposition: A | Discharge status: Home / Self Care | Payer: No Typology Code available for payment source | Source: Ambulatory Visit | Attending: Family Medicine | Admitting: Family Medicine

## 2020-06-24 ENCOUNTER — Other Ambulatory Visit: Payer: Self-pay

## 2020-06-24 ENCOUNTER — Ambulatory Visit (INDEPENDENT_AMBULATORY_CARE_PROVIDER_SITE_OTHER): Payer: No Typology Code available for payment source | Admitting: Family Medicine

## 2020-06-24 ENCOUNTER — Encounter: Payer: Self-pay | Admitting: Family Medicine

## 2020-06-24 DIAGNOSIS — M25511 Pain in right shoulder: Secondary | ICD-10-CM | POA: Diagnosis not present

## 2020-06-24 HISTORY — DX: Pain in right shoulder: M25.511

## 2020-06-24 NOTE — Patient Instructions (Addendum)
Ibuprofen as needed- with food  Ice -10 minutes when able   Do the range of motion exercise   Xray of shoulder today - we will call when we get a reading   Use good mechanics at work- take your time  If worse - let us know   Once you improve-do the next set of shoulder exercises   Take care of yourself !

## 2020-06-24 NOTE — Assessment & Plan Note (Signed)
Shoulder strain Stephen Ramos early impingement  Xray today  Continue nsaid/ice/muscle rub Will start rom exercises  When better- rehab exercises for strength  Update if not starting to improve in a week or if worsening    Disc eval of ergonomics at work re ; lift/push/pull

## 2020-06-24 NOTE — Progress Notes (Signed)
Subjective:    Patient ID: Stephen Ramos., male    DOB: 07/04/1960, 60 y.o.   MRN: [id number]  This visit occurred during the SARS-CoV-2 public health emergency.  Safety protocols were in place, including screening questions prior to the visit, additional usage of staff PPE, and extensive cleaning of exam room while observing appropriate contact time as indicated for disinfecting solutions.    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 Right shoulder pain 06/24/2020  . Plantar wart of right foot 05/11/2020  . Acute back pain 09/11/2019  . Hordeolum internum left lower eyelid 10/11/2016  . Hyperlipidemia 03/22/2016  . Prediabetes 03/22/2016  . Preventative health care 12/27/2014  . Obesity (BMI 30.0-34.9) 12/15/2014   Past Medical History:  Diagnosis Date  . Chicken pox   . Heart murmur 2011  . Prediabetes   . Vertigo    Past Surgical History:  Procedure Laterality Date  . CHOLECYSTECTOMY  2012   Social History   Tobacco Use  . Smoking status: Never Smoker  . Smokeless tobacco: Never Used  Substance Use Topics  . Alcohol use: No    Alcohol/week: 0.0 standard drinks  . Drug use: No   Family History  Problem Relation Age of Onset  . Diabetes Mother   . Diabetes Father   . Heart disease Father 58        Myocardial  . Kidney disease Father   . Stroke Father   . Hypertension Father   . Colon cancer Neg Hx    No Known Allergies Current Outpatient Medications on File Prior to Visit  Medication Sig Dispense Refill  . MULTIPLE VITAMIN PO Take 1 tablet by mouth every other day.      No current facility-administered medications on file prior to visit.     Review of Systems  Constitutional: Negative for activity change, appetite change, fatigue, fever and unexpected weight change.  HENT: Negative for congestion, rhinorrhea, sore throat and trouble swallowing.   Eyes: Negative for pain, redness, itching and visual disturbance.  Respiratory: Negative for cough, chest tightness, shortness of breath and wheezing.   Cardiovascular: Negative for chest pain and palpitations.  Gastrointestinal: Negative for abdominal pain, blood in stool, constipation, diarrhea and nausea.  Endocrine: Negative for cold intolerance, heat intolerance, polydipsia and polyuria.  Genitourinary: Negative for difficulty urinating, dysuria, frequency and urgency.  Musculoskeletal: Positive for arthralgias. Negative for joint swelling and myalgias.       Right shoulder pain and stiffness  Skin: Negative for pallor and rash.  Neurological: Negative for dizziness, tremors, weakness, numbness and headaches.  Hematological: Negative  for adenopathy. Does not bruise/bleed easily.  Psychiatric/Behavioral: Negative for decreased concentration and dysphoric mood. The patient is not nervous/anxious.        Objective:   Physical Exam Constitutional:      General: He is not in acute distress.    Appearance: Normal appearance. He is obese. He is not ill-appearing.     Comments: Muscular build  Eyes:     Conjunctiva/sclera: Conjunctivae normal.     Pupils: Pupils are equal, round, and reactive to light.  Cardiovascular:     Rate and Rhythm: Regular rhythm.  Pulmonary:     Effort: Pulmonary effort is normal.     Breath  sounds: Normal breath sounds.  Musculoskeletal:        General: No swelling.     Right shoulder: Tenderness and bony tenderness present. No swelling, deformity, effusion or crepitus. Decreased range of motion. Normal strength. Normal pulse.     Cervical back: Normal range of motion and neck supple. No tenderness.     Comments: R shoulder  Pos Hawking and Neer tests for mild anterior shoulder pain Int/ext rotation are mildly limited  Abduct to 90 dg- then pain  Mild acromial tenderness    Skin:    General: Skin is warm and dry.     Coloration: Skin is not pale.     Findings: No erythema.  Neurological:     General: No focal deficit present.     Mental Status: He is alert.     Sensory: No sensory deficit.     Motor: No weakness.  Psychiatric:        Mood and Affect: Mood normal.           Assessment & Plan:   Problem List Items Addressed This Visit      Other   Right shoulder pain    Shoulder strain /poss early impingement  Xray today  Continue nsaid/ice/muscle rub Will start rom exercises  When better- rehab exercises for strength  Update if not starting to improve in a week or if worsening    Disc eval of ergonomics at work re ; lift/push/pull      Relevant Orders   DG Shoulder Right

## 2020-06-27 ENCOUNTER — Telehealth: Payer: Self-pay | Admitting: *Deleted

## 2020-06-27 DIAGNOSIS — M25511 Pain in right shoulder: Secondary | ICD-10-CM

## 2020-06-27 NOTE — Telephone Encounter (Signed)
-----   Message from Abner Greenspan, MD sent at 06/25/2020  6:05 PM EST ----- Mild deg change of AC joint noted (where his pain is) If not significantly improved in a week would like him to have appt with sport med/Dr Copland

## 2020-06-27 NOTE — Telephone Encounter (Signed)
Left VM requesting pt to call the office back 

## 2020-06-28 NOTE — Telephone Encounter (Signed)
I placed the referral.

## 2020-06-28 NOTE — Telephone Encounter (Signed)
Pt called back advised of xray results and Dr. Marliss Coots comments. Pt said that the exercises/ stretches you gave him helped some, he's had mild improvement in his pain. Since the exercises/ stretches helped some pt doesn't want an appt with Dr. Lorelei Pont he would like a referral for PT to see if that will continue to help pain. Pt said he can go to either [facility] or [place], it depends on who can see him on his days off (Thursday, Friday), I advise pt if Dr. Glori Bickers is okay with referral we will put it in and our [facility] will call to schedule it, if not I will call him back

## 2020-06-30 ENCOUNTER — Ambulatory Visit (INDEPENDENT_AMBULATORY_CARE_PROVIDER_SITE_OTHER): Payer: No Typology Code available for payment source | Admitting: Rehabilitative and Restorative Service Providers"

## 2020-06-30 ENCOUNTER — Encounter: Payer: Self-pay | Admitting: Rehabilitative and Restorative Service Providers"

## 2020-06-30 ENCOUNTER — Other Ambulatory Visit: Payer: Self-pay

## 2020-06-30 DIAGNOSIS — M25511 Pain in right shoulder: Secondary | ICD-10-CM

## 2020-06-30 DIAGNOSIS — R293 Abnormal posture: Secondary | ICD-10-CM

## 2020-06-30 DIAGNOSIS — M25611 Stiffness of right shoulder, not elsewhere classified: Secondary | ICD-10-CM

## 2020-06-30 NOTE — Therapy (Signed)
[facility] Therapy [address] [place], [place], [address] Phone: [phone]   Fax:  [phone]  Physical Therapy Evaluation  Patient Details  Name: Stephen Ramos. MRN: [id number] Date of Birth: 1961/03/08 Referring Provider (PT): Judy Pimple MD   Encounter Date: 06/30/2020   PT End of Session - 06/30/20 1356    Visit Number 1    Number of Visits 12    Date for PT Re-Evaluation 08/26/20    PT Start Time 1301    PT Stop Time 1341    PT Time Calculation (min) 40 min    Activity Tolerance Patient tolerated treatment well;No increased pain    Behavior During Therapy WFL for tasks assessed/performed           Past Medical History:  Diagnosis Date  . Chicken pox   . Heart murmur 2011  . Prediabetes   . Vertigo     Past Surgical History:  Procedure Laterality Date  . CHOLECYSTECTOMY  2012    There were no vitals filed for this visit.    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  Pain has been getting better but is still affects all R UE function including sleep if he "moves the wrong way."    Pertinent History Pre-diabetes    Limitations Lifting;House hold activities    Diagnostic tests X-ray    Patient Stated Goals Return to normal workouts and normal function    Currently in Pain? 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  No    Is the patient reluctant to leave their home because of a fear of falling?  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      Cognition   Overall Cognitive Status Within Functional Limits for tasks assessed      Observation/Other Assessments   Focus on Therapeutic Outcomes (FOTO)  41 (71 Goal)      ROM / Strength   AROM / PROM / Strength AROM;Strength      AROM   Overall AROM  Deficits    AROM Assessment Site Shoulder    Right/Left Shoulder Left;Right    Right Shoulder Flexion 130 Degrees    Right Shoulder Internal Rotation 20 Degrees    Right Shoulder External Rotation 60  Degrees    Right Shoulder Horizontal  ADduction 20 Degrees    Left Shoulder Flexion 175 Degrees    Left Shoulder Internal Rotation 70 Degrees    Left Shoulder External Rotation 85 Degrees    Left Shoulder Horizontal ADduction 40 Degrees      Strength   Overall Strength Deficits    Strength Assessment Site Shoulder    Right/Left Shoulder Left;Right    Right Shoulder Internal Rotation 4/5    Right Shoulder External Rotation 4-/5    Left Shoulder Internal Rotation 5/5    Left Shoulder External Rotation 5/5                      Objective measurements completed on examination: See above findings.       [place] Adult PT Treatment/Exercise - 06/30/20 0001      Exercises   Exercises Shoulder      Shoulder Exercises: Supine   Protraction Strengthening;Both;20 reps    Protraction Limitations 3 seconds reach only weights PRN    External Rotation AROM;Right;10 reps;Other (comment)    External Rotation Limitations 10 seconds    Internal Rotation AROM;Right;10 reps;Other (comment)    Internal Rotation  Limitations 10 seconds      Shoulder Exercises: Seated   Retraction Strengthening;10 reps;Other (comment)    Retraction Limitations 5 seconds (shoulder blade pinches)                  PT Education - 06/30/20 1356    Education Details Discussed shoulder anatomy, imaging, exam findings, mechanism and stages of impingement and reviewed starter HEP.    Person(s) Educated Patient    Methods Explanation;Demonstration;Tactile cues;Verbal cues;Handout    Comprehension Verbal cues required;Need further instruction;Returned demonstration;Verbalized understanding;Tactile cues required               PT Long Term Goals - 06/30/20 1402      PT LONG TERM GOAL #1   Title Stephen Ramos will score 71 on the FOTO functional Scale.    Baseline 41    Time 6    Period Weeks    Status New    Target Date 08/26/20      PT LONG TERM GOAL #2   Title Improve R shoulder pain to 0-2/10 on the Numeric Pain Rating Scale.    Baseline Can be 5-7/10    Time 6    Period Weeks    Status New    Target Date 08/26/20      PT LONG TERM GOAL #3   Title Improve R shoulder AROM for flexion to 170; ER to 90; IR to 60 and HA to 40 degrees.    Baseline 130; 60; 20; 20 respectively.    Time 6    Period Weeks    Status New    Target Date 08/26/20      PT LONG TERM GOAL #4   Title Improve MMT score to 5/5 for R shoulder IR and ER.    Baseline 4 and 4-/5, respectively    Time 6    Period Weeks    Status New    Target Date 08/26/20      PT LONG TERM GOAL #5   Title Stephen Ramos will be independent and compliant with his long-term HEP at DC.    Time 6    Period Weeks    Status New    Target Date 08/26/20  Plan - 06/30/20 1357    Clinical Impression Statement Stephen Ramos appears to have a R shoulder impingement with associated capsular tightness.  We should know within 4 weeks if he is in the bursitis/tendonitis category or if he is a high risk for a rotator cuff tear.  Capsular stretching,  shoulder AROM, scapular and rotator cuff strengthening will be used to address impairments noted at evaluation today.  His prognosis to improve is good.  If it is determined in 4 weeks that Stephen Ramos possibly has a RTC tear, he will be referred for further testing and a possible MRI.    Examination-Activity Limitations Dressing;Sleep;Caring for Others;Carry;Reach Overhead    Examination-Participation Restrictions Occupation;Community Activity    Stability/Clinical Decision Making Stable/Uncomplicated    Clinical Decision Making Low    Rehab Potential Good    PT Frequency 2x / week    PT Duration 6 weeks    PT Treatment/Interventions ADLs/Self Care Home Management;Cryotherapy;Therapeutic activities;Therapeutic exercise;Neuromuscular re-education;Patient/family education;Manual techniques;Passive range of motion;Vasopneumatic Device;Joint Manipulations    PT Next Visit Plan Review HEP.  Progress scapular and RTC strengthening along with capsular stretching.    PT Home Exercise Plan Access Code: [email or web address]    Recommended Other Services Follow-up with orthopedic specialist and MRI if not significantly improved in 4 weeks.    Consulted and Agree with Plan of Care Patient           Patient will benefit from skilled therapeutic intervention in order to improve the following deficits and impairments:  Decreased activity tolerance,Decreased range of motion,Decreased mobility,Decreased strength,Hypomobility,Impaired flexibility,Impaired UE functional use,Pain,Increased edema  Visit Diagnosis: Abnormal posture  Stiffness of right shoulder, not elsewhere classified  Acute pain of right shoulder     Problem List Patient Active Problem List   Diagnosis Date Noted  . Right shoulder pain 06/24/2020  . Plantar wart of right foot 05/11/2020  . Acute back pain 09/11/2019  . Hordeolum internum left lower eyelid 10/11/2016  . Hyperlipidemia 03/22/2016  . Prediabetes 03/22/2016  . Preventative health care  12/27/2014  . Obesity (BMI 30.0-34.9) 12/15/2014    Farley Ly PT, MPT 06/30/2020, 2:06 PM  [facility] Physical Therapy [address] [place], [place], [address] Phone: [phone]   Fax:  [phone]  Name: Gawain Ramos. MRN: [id number] Date of Birth: 1960/06/17

## 2020-06-30 NOTE — Patient Instructions (Signed)
Access Code: [email or web address] URL: [email or web address] Date: 06/30/2020 Prepared by: Vista Mink  Exercises Standing Scapular Retraction - 5 x daily - 7 x weekly - 1 sets - 5 reps - 5 second hold Supine Scapular Protraction in Flexion with Dumbbells - 2-3 x daily - 7 x weekly - 1 sets - 20 reps - 3 seconds hold Supine Shoulder Internal Rotation Stretch - 2-3 x daily - 7 x weekly - 1 sets - 10-20 reps - 10 secondsinutes hold Supine Shoulder External Rotation Stretch - 2-3 x daily - 7 x weekly - 1 sets - 10-20 reps - 10 seconds hold

## 2020-07-08 ENCOUNTER — Other Ambulatory Visit: Payer: Self-pay

## 2020-07-08 ENCOUNTER — Ambulatory Visit (INDEPENDENT_AMBULATORY_CARE_PROVIDER_SITE_OTHER): Payer: No Typology Code available for payment source | Admitting: Rehabilitative and Restorative Service Providers"

## 2020-07-08 DIAGNOSIS — R293 Abnormal posture: Secondary | ICD-10-CM | POA: Diagnosis not present

## 2020-07-08 DIAGNOSIS — M25611 Stiffness of right shoulder, not elsewhere classified: Secondary | ICD-10-CM | POA: Diagnosis not present

## 2020-07-08 DIAGNOSIS — M25511 Pain in right shoulder: Secondary | ICD-10-CM

## 2020-07-08 NOTE — Therapy (Addendum)
[facility] Therapy [address] [place], [place], [address] Phone: [phone]   Fax:  [phone]  Physical Therapy Treatment/Discharge  Patient Details  Name: Stephen Ramos. MRN: [id number] Date of Birth: 1960-06-29 Referring Provider (PT): Abner Greenspan MD   Encounter Date: 07/08/2020   PT End of Session - 07/08/20 1101    Visit Number 2    Number of Visits 12    Date for PT Re-Evaluation 08/26/20    PT Start Time 1100    PT Stop Time 1139    PT Time Calculation (min) 39 min    Activity Tolerance Patient tolerated treatment well;No increased pain    Behavior During Therapy WFL for tasks assessed/performed           Past Medical History:  Diagnosis Date  . Chicken pox   . Heart murmur 2011  . Prediabetes   . Vertigo     Past Surgical History:  Procedure Laterality Date  . CHOLECYSTECTOMY  2012    There were no vitals filed for this visit.   Subjective Assessment - 07/08/20 1106    Subjective Pt. stated feeling like he was doing better overall since first visit with more movement..    Pertinent History Pre-diabetes    Limitations Lifting;House hold activities    Diagnostic tests X-ray    Patient Stated Goals Return to normal workouts and normal function    Currently in Pain? Yes    Pain Score 2    rated mild   Pain Location Shoulder    Pain Orientation Right    Pain Descriptors / Indicators Aching;Sore;Tightness    Pain Type Acute pain    Pain Onset 1 to 4 weeks ago    Pain Frequency Intermittent    Aggravating Factors  lifting, carrying, end range    Pain Relieving Factors exercises seemed to help              [facility] PT Assessment - 07/08/20 0001      Strength   Right Shoulder Flexion 4+/5    Right Shoulder ABduction 4/5   c pain                        [facility] Adult PT Treatment/Exercise - 07/08/20 0001      Shoulder Exercises: Supine   Protraction Strengthening;Right   2-3 second hold x 10  review for HEP   Other Supine Exercises supine retraction 5 sec hold into table 5x      Shoulder Exercises: Seated   Retraction Both;5 reps   seated     Shoulder Exercises: Sidelying   ABduction Right   2 x 10     Shoulder Exercises: Standing   Other Standing Exercises standing reactive ER green band holds with step out 5 sec hold x 10    Other Standing Exercises standing tband rows, gh ext x 10 each way green band      Shoulder Exercises: Pulleys   Flexion 3 minutes    ABduction 3 minutes      Manual Therapy   Manual therapy comments ER/IR end range stretching/MET in 60-70 deg abd.  MWM c ER                  PT Education - 07/08/20 1128    Education Details HEP progression    Person(s) Educated Patient    Methods Explanation;Demonstration;Handout;Verbal cues    Comprehension Verbalized understanding;Returned demonstration  PT Long Term Goals - 06/30/20 1402      PT LONG TERM GOAL #1   Title [name] will score 71 on the FOTO functional Scale.    Baseline 41    Time 6    Period Weeks    Status New    Target Date 08/26/20      PT LONG TERM GOAL #2   Title Improve R shoulder pain to 0-2/10 on the Numeric Pain Rating Scale.    Baseline Can be 5-7/10    Time 6    Period Weeks    Status New    Target Date 08/26/20      PT LONG TERM GOAL #3   Title Improve R shoulder AROM for flexion to 170; ER to 90; IR to 60 and HA to 40 degrees.    Baseline 130; 60; 20; 20 respectively.    Time 6    Period Weeks    Status New    Target Date 08/26/20      PT LONG TERM GOAL #4   Title Improve MMT score to 5/5 for R shoulder IR and ER.    Baseline 4 and 4-/5, respectively    Time 6    Period Weeks    Status New    Target Date 08/26/20      PT LONG TERM GOAL #5   Title [name] will be independent and compliant with his long-term HEP at DC.    Time 6    Period Weeks    Status New    Target Date 08/26/20                 Plan - 07/08/20 1134     Clinical Impression Statement Presentation today of improved passive and active mobility compared to evaluation, c reduced symptoms noted.  Abduction mobility and strength limited today more than forward flexion.  Transitioning to scapular and [place] stability/strengthening today c a few adjustments to HEP to help continued progression.   Good initial recall of HEP at this time.    Examination-Activity Limitations Dressing;Sleep;Caring for Others;Carry;Reach Overhead    Examination-Participation Restrictions Occupation;Community Activity    Stability/Clinical Decision Making Stable/Uncomplicated    Rehab Potential Good    PT Frequency 2x / week    PT Duration 6 weeks    PT Treatment/Interventions ADLs/Self Care Home Management;Cryotherapy;Therapeutic activities;Therapeutic exercise;Neuromuscular re-education;Patient/family education;Manual techniques;Passive range of motion;Vasopneumatic Device;Joint Manipulations    PT Next Visit Plan Scapular and gh strength/stability improvements.  Continue to progress ER/IR mobility in 60-90 deg abduction.    PT Home Exercise Plan Access Code: [email or web address]    Consulted and Agree with Plan of Care Patient           Patient will benefit from skilled therapeutic intervention in order to improve the following deficits and impairments:  Decreased activity tolerance,Decreased range of motion,Decreased mobility,Decreased strength,Hypomobility,Impaired flexibility,Impaired UE functional use,Pain,Increased edema  Visit Diagnosis: Acute pain of right shoulder  Abnormal posture  Stiffness of right shoulder, not elsewhere classified     Problem List Patient Active Problem List   Diagnosis Date Noted  . Right shoulder pain 06/24/2020  . Plantar wart of right foot 05/11/2020  . Acute back pain 09/11/2019  . Hordeolum internum left lower eyelid 10/11/2016  . Hyperlipidemia 03/22/2016  . Prediabetes 03/22/2016  . Preventative health care 12/27/2014  . Obesity  (BMI 30.0-34.9) 12/15/2014   Scot Jun, PT, DPT, OCS, ATC 07/08/20  11:43 AM   PHYSICAL THERAPY DISCHARGE SUMMARY  Visits from  Start of Care: 2  Current functional level related to goals / functional outcomes: See note   Remaining deficits: See note   Education / Equipment: HEP Plan: Patient agrees to discharge.  Patient goals were not met. Patient is being discharged due to financial reasons.  ?????  Insurance not covering.   Scot Jun, PT, DPT, OCS, ATC 07/14/20  10:30 AM          [facility] Physical Therapy [address] [place], [place], [address] Phone: [phone]   Fax:  [phone]  Name: Stephen Ramos. MRN: [id number] Date of Birth: 1961/05/28

## 2020-07-08 NOTE — Patient Instructions (Signed)
Access Code: [email or web address] URL: [email or web address] Date: 07/08/2020 Prepared by: Scot Jun  Exercises Standing Scapular Retraction - 5 x daily - 7 x weekly - 1 sets - 5 reps - 5 second hold Supine Scapular Protraction in Flexion with Dumbbells - 2-3 x daily - 7 x weekly - 1 sets - 20 reps - 3 seconds hold Supine Shoulder Internal Rotation Stretch - 2-3 x daily - 7 x weekly - 1 sets - 10-20 reps - 10 secondsinutes hold Supine Shoulder External Rotation Stretch - 2-3 x daily - 7 x weekly - 1 sets - 10-20 reps - 10 seconds hold Shoulder External Rotation Reactive Isometrics - 1 x daily - 7 x weekly - 1 sets - 15 reps - 5 hold Standing Shoulder Row with Anchored Resistance - 1 x daily - 7 x weekly - 3 sets - 10 reps Shoulder Extension with Resistance - 1 x daily - 7 x weekly - 3 sets - 10 reps

## 2020-07-14 ENCOUNTER — Ambulatory Visit: Payer: No Typology Code available for payment source | Admitting: Rehabilitative and Restorative Service Providers"

## 2020-07-15 ENCOUNTER — Encounter: Payer: No Typology Code available for payment source | Admitting: Rehabilitative and Restorative Service Providers"

## 2020-07-22 ENCOUNTER — Encounter: Payer: No Typology Code available for payment source | Admitting: Rehabilitative and Restorative Service Providers"

## 2020-07-29 ENCOUNTER — Encounter: Payer: No Typology Code available for payment source | Admitting: Rehabilitative and Restorative Service Providers"

## 2020-08-05 ENCOUNTER — Encounter: Payer: No Typology Code available for payment source | Admitting: Rehabilitative and Restorative Service Providers"

## 2020-08-25 ENCOUNTER — Encounter: Payer: Self-pay | Admitting: Primary Care

## 2020-08-26 ENCOUNTER — Ambulatory Visit: Payer: No Typology Code available for payment source | Admitting: Family Medicine

## 2020-09-01 ENCOUNTER — Ambulatory Visit (INDEPENDENT_AMBULATORY_CARE_PROVIDER_SITE_OTHER): Payer: No Typology Code available for payment source | Admitting: Family Medicine

## 2020-09-01 ENCOUNTER — Other Ambulatory Visit: Payer: Self-pay

## 2020-09-01 ENCOUNTER — Encounter: Payer: Self-pay | Admitting: Family Medicine

## 2020-09-01 VITALS — BP 100/60 | HR 73 | Temp 97.8°F | Ht 70.0 in | Wt 241.0 lb

## 2020-09-01 DIAGNOSIS — R29898 Other symptoms and signs involving the musculoskeletal system: Secondary | ICD-10-CM | POA: Diagnosis not present

## 2020-09-01 DIAGNOSIS — M25511 Pain in right shoulder: Secondary | ICD-10-CM | POA: Diagnosis not present

## 2020-09-01 NOTE — Progress Notes (Signed)
Stephen Ramos T. Ra Pfiester, MD, [place]  Primary Care and [place] [facility] [place], [address]  Phone: [phone]  FAX: [phone]  Stephen Ramos. - 60 y.o. male  MRN [id number]  Date of Birth: 1961-03-08  Date: 09/01/2020  PCP: Pleas Koch, NP  Referral: Pleas Koch, NP  Chief Complaint  Patient presents with  . Shoulder Pain    Right    This visit occurred during the SARS-CoV-2 public health emergency.  Safety protocols were in place, including screening questions prior to the visit, additional usage of staff PPE, and extensive cleaning of exam room while observing appropriate contact time as indicated for disinfecting solutions.   Subjective:   Stephen Ramos. is a 67 y.o. very pleasant male patient with Body mass index is 34.58 kg/m. who presents with the following:  R shoulder pain: He is a very pleasant 60 year old gentleman who is a highly active and works out routinely.  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  3 months ago he had an acute injury where he felt some distinct pain, and since this he has been having some problems fairly continually since then.  After his initial injury he did hurt significantly, and he has had some waxing and waning pain since then.  He has done some physical therapy, he also has done some rehab from the [place] and [place] rehab protocols on the computer.  Is also done a hanging protocol from one of the orthopedic physicians in [place].  I have reviewed this program and literature multiple times myself.  He still has some persistent pain, loss of motion and weakness, in particular he has pain with terminal abduction and internal rotation.  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   Review of Systems is noted in the HPI, as appropriate   Patient Active Problem  List   Diagnosis Date Noted  . Right shoulder pain 06/24/2020  . Plantar wart of right foot 05/11/2020  . Acute back pain 09/11/2019  . Hordeolum internum left lower eyelid 10/11/2016  . Hyperlipidemia 03/22/2016  . Prediabetes 03/22/2016  . Preventative health care 12/27/2014  . Obesity (BMI 30.0-34.9) 12/15/2014    Past Medical History:  Diagnosis Date  . Chicken pox   . Heart murmur 2011  . Prediabetes   . Vertigo     Past Surgical History:  Procedure Laterality Date  . CHOLECYSTECTOMY  2012    Family History  Problem Relation Age of Onset  . Diabetes Mother   . Diabetes Father   . Heart disease Father 40       Myocardial  . Kidney disease Father   . Stroke Father   . Hypertension Father   . Colon cancer Neg Hx      Objective:   BP 100/60   Pulse 73   Temp 97.8 F (36.6 C) (Temporal)   Ht 5\' 10"  (1.778 m)   Wt 241 lb (109.3 kg)   SpO2 97%   BMI 34.58 kg/m    Shoulder: R Inspection: No muscle wasting.  With abduction of the shoulder, the right scapula elevates fairly dramatically compared to the contralateral side.  On viewing the posterior layer, the scapula is not stabilized with abduction and flexion, obviously. Ecchymosis/edema: neg  AC joint, scapula, clavicle: NT Cervical spine: NT, full ROM Spurling's: neg Abduction:  Limited to 160, 4-/5 Flexion: Limited to 160, 5/5 IR, full, lift-off: 5/5 ER at neutral: full, 5/5 AC crossover and compression: neg Neer: Positive, effectively not able to do. Hawkins: Pain Drop Test: neg Empty Can: neg Supraspinatus insertion: NT Bicipital groove: NT Speed's: neg Yergason's: neg Sulcus sign: neg Scapular dyskinesis: As above C5-T1 intact Sensation intact Grip 5/5   Radiology: DG Shoulder Right  Result Date: 06/24/2020 CLINICAL DATA:  Shoulder pain, no known injury, initial encounter EXAM: RIGHT SHOULDER - 2+ VIEW COMPARISON:  None. FINDINGS: Mild degenerative changes of the acromioclavicular joint  are noted. No acute fracture or dislocation is noted. No soft tissue abnormality is seen. Underlying bony thorax is within normal limits. IMPRESSION: Mild degenerative change without acute abnormality. Electronically Signed   By: Inez Catalina M.D.   On: 06/24/2020 21:13    Assessment and Plan:     ICD-10-CM   1. Acute pain of right shoulder  M25.511 MR Shoulder Right Wo Contrast  2. Shoulder weakness  R29.898 MR Shoulder Right Wo Contrast   Failure to improve decide conservative measures of the shoulder.  Clinical concern for potential rotator cuff tear.  Distinct injury 3 months ago.  Failure of physical therapy, home rehab, multiple oral medications without resolution of symptoms.  He is also having some early restriction of motion.  Obtain an MRI of the right shoulder without contrast to evaluate for supraspinatus tear. (RTC tear).  Evaluate for full-thickness or high-grade partial-thickness rotator cuff tear additional disposition will depend upon MRI findings.  Social: This is significantly impacting his ability to work out and be functional with the affected shoulder.  Orders Placed This Encounter  Procedures  . MR Shoulder Right Wo Contrast    Follow-up: Return in about 10 weeks (around 11/10/2020).  Signed,  Maud Deed. Meshach Perry, MD   Outpatient Encounter Medications as of 09/01/2020  Medication Sig  . MULTIPLE VITAMIN PO Take 1 tablet by mouth every other day.    No facility-administered encounter medications on file as of 09/01/2020.

## 2020-09-02 ENCOUNTER — Ambulatory Visit: Payer: No Typology Code available for payment source | Admitting: Family Medicine

## 2020-09-15 ENCOUNTER — Encounter: Payer: Self-pay | Admitting: Primary Care

## 2020-09-23 ENCOUNTER — Ambulatory Visit
Admission: RE | Admit: 2020-09-23 | Discharge: 2020-09-23 | Disposition: A | Discharge status: Home / Self Care | Payer: No Typology Code available for payment source | Source: Ambulatory Visit | Attending: Family Medicine | Admitting: Family Medicine

## 2020-09-23 DIAGNOSIS — R29898 Other symptoms and signs involving the musculoskeletal system: Secondary | ICD-10-CM

## 2020-09-23 DIAGNOSIS — M25511 Pain in right shoulder: Secondary | ICD-10-CM

## 2020-09-26 ENCOUNTER — Telehealth: Payer: Self-pay | Admitting: *Deleted

## 2020-09-26 NOTE — Telephone Encounter (Signed)
Relayed through results.

## 2020-09-26 NOTE — Telephone Encounter (Signed)
Patient left a voicemail stating that he has gotten the results from his MRI. Patient wants to know what the next step would be.

## 2020-10-06 ENCOUNTER — Encounter: Payer: Self-pay | Admitting: Primary Care

## 2020-11-04 ENCOUNTER — Encounter: Payer: Self-pay | Admitting: Primary Care

## 2020-12-08 ENCOUNTER — Ambulatory Visit (INDEPENDENT_AMBULATORY_CARE_PROVIDER_SITE_OTHER): Payer: No Typology Code available for payment source | Admitting: Family Medicine

## 2020-12-08 ENCOUNTER — Other Ambulatory Visit: Payer: Self-pay

## 2020-12-08 ENCOUNTER — Encounter: Payer: Self-pay | Admitting: Family Medicine

## 2020-12-08 VITALS — BP 110/62 | HR 71 | Temp 97.6°F | Ht 70.0 in | Wt 247.0 lb

## 2020-12-08 DIAGNOSIS — M7501 Adhesive capsulitis of right shoulder: Secondary | ICD-10-CM | POA: Diagnosis not present

## 2020-12-08 NOTE — Progress Notes (Signed)
Loletta Harper T. Jeryn Bertoni, MD, [place] at [facility] [place], [address]  Phone: [phone]  FAX: [place]. - 60 y.o. male  MRN [id number]  Date of Birth: 01/11/1961  Date: 12/08/2020  PCP: Pleas Koch, NP  Referral: Pleas Koch, NP  Chief Complaint  Patient presents with   Follow-up    Right-Frozen shoulder after PT    This visit occurred during the SARS-CoV-2 public health emergency.  Safety protocols were in place, including screening questions prior to the visit, additional usage of staff PPE, and extensive cleaning of exam room while observing appropriate contact time as indicated for disinfecting solutions.   Subjective:   Stephen Ramos. is a 72 y.o. very pleasant male patient with Body mass index is 35.44 kg/m. 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  The last time he is here, I was worried about him having a full-thickness rotator cuff tear, and thankfully he did not have a major tear on his right shoulder MRI.  About 80% back. Works out every other day.  When he is warmed up and working out, he does feel like his shoulder moves better and he can get more range of motion work on it. This is true when he is very active in the gym or another aspects of movement.  Weights and cardio. With cardio, will loosen and  5 days a week working.   Str is ok  Hanging -he is following of protocol based on the published protocol out of [place].  With sitting down on his desk, will tense while sitting.  He feels somewhat stiffer than.  Review of Systems is noted in the HPI, as appropriate   Objective:   BP 110/62   Pulse 71   Temp 97.6 F (36.4 C) (Temporal)   Ht 5\' 10"  (1.778 m)   Wt 247 lb (112 kg)   SpO2 99%   BMI 35.44 kg/m    Shoulder: R and L Inspection: No muscle  wasting or winging Ecchymosis/edema: neg  AC joint, scapula, clavicle: NT Cervical spine: NT, full ROM Spurling's: neg ABNORMAL SIDE TESTED: Right UNLESS OTHERWISE NOTED, THE CONTRALATERAL SIDE HAS FULL RANGE OF MOTION. Abduction: 5/5, LIMITED TO 150  DEGREES Flexion: 5/5, LIMITED TO 150 DEGNO ROM  IR, lift-off: 5/5. TESTED AT 90 DEGREES OF ABDUCTION, LIMITED TO 10 DEGREES ER at neutral:  5/5, TESTED AT 90 DEGREES OF ABDUCTION, LIMITED TO 70 DEGREES AC crossover and compression: PAIN Drop Test: neg Empty Can: neg Supraspinatus insertion: NT Bicipital groove: NT ALL OTHER SPECIAL TESTING EQUIVOCAL GIVEN LOSS OF MOTION C5-T1 intact Sensation intact Grip 5/5    Radiology: MR Shoulder Right Wo Contrast  Result Date: 09/24/2020 CLINICAL DATA:  Right shoulder pain extending inferiorly. EXAM: MRI OF THE RIGHT SHOULDER WITHOUT CONTRAST TECHNIQUE: Multiplanar, multisequence MR imaging of the shoulder was performed. No intravenous contrast was administered. COMPARISON:  None FINDINGS: Rotator cuff: Mild tendinosis of the supraspinatus tendon with a small insertional interstitial tear. Mild tendinosis of the infraspinatus tendon. Teres minor tendon is intact. Subscapularis tendon is intact. Muscles: No muscle atrophy or edema. No intramuscular fluid collection or hematoma. Biceps Long Head: Mild tendinosis of the intra-articular portion of the long head of the biceps tendon. Acromioclavicular Joint: Moderate arthropathy of the acromioclavicular joint. Type I acromion. No subacromial/subdeltoid bursal fluid.  Glenohumeral Joint: Small joint effusion. No chondral defect. Thickening of the inferior glenohumeral ligament as can be seen with adhesive capsulitis or ligament strain. Labrum: Grossly intact, but evaluation is limited by lack of intraarticular fluid/contrast. Bones: No fracture or dislocation. No aggressive osseous lesion. Other: No fluid collection or hematoma. IMPRESSION: 1. Mild tendinosis of  the supraspinatus tendon with a small insertional interstitial tear. 2. Mild tendinosis of the infraspinatus tendon. 3. Thickening of the inferior glenohumeral ligament as can be seen with adhesive capsulitis or ligament strain. Electronically Signed   By: Kathreen Devoid   On: 09/24/2020 14:25     Assessment and Plan:     ICD-10-CM   1. Adhesive capsulitis of right shoulder  M75.01      He has really done a very good job rehabbing his shoulder.  I think that he is in the thawing phase, and I think that this will slowly progress over time.  He is very strong, and I think that his regular working out is making a significant difference here.  He can follow-up with me as needed only  Signed,  Cira Deyoe T. Jesiah Yerby, MD   Outpatient Encounter Medications as of 12/08/2020  Medication Sig   MULTIPLE VITAMIN PO Take 1 tablet by mouth every other day.    No facility-administered encounter medications on file as of 12/08/2020.

## 2020-12-09 ENCOUNTER — Encounter: Payer: Self-pay | Admitting: Family Medicine

## 2020-12-28 ENCOUNTER — Encounter: Payer: Self-pay | Admitting: Primary Care

## 2021-02-08 ENCOUNTER — Encounter: Payer: Self-pay | Admitting: Primary Care

## 2021-04-25 ENCOUNTER — Encounter: Payer: No Typology Code available for payment source | Admitting: Primary Care

## 2021-07-12 ENCOUNTER — Encounter: Payer: No Typology Code available for payment source | Admitting: Primary Care

## 2021-07-14 ENCOUNTER — Other Ambulatory Visit: Payer: Self-pay

## 2021-07-14 ENCOUNTER — Ambulatory Visit (INDEPENDENT_AMBULATORY_CARE_PROVIDER_SITE_OTHER): Payer: No Typology Code available for payment source | Admitting: Family Medicine

## 2021-07-14 ENCOUNTER — Encounter: Payer: Self-pay | Admitting: Family Medicine

## 2021-07-14 VITALS — BP 118/76 | HR 81 | Temp 97.5°F | Ht 70.0 in | Wt 246.0 lb

## 2021-07-14 DIAGNOSIS — H5789 Other specified disorders of eye and adnexa: Secondary | ICD-10-CM | POA: Diagnosis not present

## 2021-07-14 DIAGNOSIS — S90851A Superficial foreign body, right foot, initial encounter: Secondary | ICD-10-CM | POA: Diagnosis not present

## 2021-07-14 DIAGNOSIS — S90859A Superficial foreign body, unspecified foot, initial encounter: Secondary | ICD-10-CM | POA: Insufficient documentation

## 2021-07-14 NOTE — Progress Notes (Signed)
Subjective:    Patient ID: Anastasia Fiedler., male    DOB: 1960/12/18, 61 y.o.   MRN: [id number]  This visit occurred during the SARS-CoV-2 public health emergency.  Safety protocols were in place, including screening questions prior to the visit, additional usage of staff PPE, and extensive cleaning of exam room while observing appropriate contact time as indicated for disinfecting solutions.   HPI 61 yo pt of NP Clark presents with eye irritation   Wt Readings from Last 3 Encounters:  07/14/21 246 lb (111.6 kg)  12/08/20 247 lb (112 kg)  09/01/20 241 lb (109.3 kg)   35.30 kg/m  Pt got lotion in eyes on Monday (body lotion) Tried flushing  Both eyes  Very irritated mon/tues with redness and tearing   Using art tears frequently to flush eyes  About 90% better   No photophobia  No vision change  Does not wear contacts  No h/o dry eyes  No eye products or conditions   Got a splinter in foot last night  Last night  Finally got it out -a friend helped  Used alcohol and soap and water   Tetanus status   Td 2019    Patient Active Problem List   Diagnosis Date Noted   Eye irritation 07/14/2021   Splinter of foot 07/14/2021   Right shoulder pain 06/24/2020   Plantar wart of right foot 05/11/2020   Acute back pain 09/11/2019   Hordeolum internum left lower eyelid 10/11/2016   Hyperlipidemia 03/22/2016   Prediabetes 03/22/2016   Preventative health care 12/27/2014   Obesity (BMI 30.0-34.9) 12/15/2014   Past Medical History:  Diagnosis Date   Chicken pox    Heart murmur 2011   Prediabetes    Vertigo    Past Surgical History:  Procedure Laterality Date   CHOLECYSTECTOMY  2012   Social History   Tobacco Use   Smoking status: Never   Smokeless tobacco: Never  Substance Use Topics   Alcohol use: No    Alcohol/week: 0.0 standard drinks   Drug use: No   Family History  Problem Relation Age of Onset   Diabetes Mother    Diabetes Father    Heart disease  Father 57       Myocardial   Kidney disease Father    Stroke Father    Hypertension Father    Colon cancer Neg Hx    No Known Allergies Current Outpatient Medications on File Prior to Visit  Medication Sig Dispense Refill   MULTIPLE VITAMIN PO Take 1 tablet by mouth every other day.      No current facility-administered medications on file prior to visit.     Review of Systems  Constitutional:  Negative for activity change, appetite change, fatigue, fever and unexpected weight change.  HENT:  Negative for congestion, rhinorrhea, sore throat and trouble swallowing.   Eyes:  Negative for pain, redness, itching and visual disturbance.  Respiratory:  Negative for cough, chest tightness, shortness of breath and wheezing.   Cardiovascular:  Negative for chest pain and palpitations.  Gastrointestinal:  Negative for abdominal pain, blood in stool, constipation, diarrhea and nausea.  Endocrine: Negative for cold intolerance, heat intolerance, polydipsia and polyuria.  Genitourinary:  Negative for difficulty urinating, dysuria, frequency and urgency.  Musculoskeletal:  Negative for arthralgias, joint swelling and myalgias.  Skin:  Negative for pallor and rash.       Had a splinter in foot   Neurological:  Negative for dizziness, tremors, weakness, numbness  and headaches.  Hematological:  Negative for adenopathy. Does not bruise/bleed easily.  Psychiatric/Behavioral:  Negative for decreased concentration and dysphoric mood. The patient is not nervous/anxious.       Objective:   Physical Exam Constitutional:      General: He is not in acute distress.    Appearance: Normal appearance. He is obese. He is not ill-appearing or diaphoretic.  HENT:     Head: Normocephalic and atraumatic.     Nose: Nose normal.     Mouth/Throat:     Mouth: Mucous membranes are moist.     Pharynx: Oropharynx is clear.  Eyes:     General: No scleral icterus.       Right eye: No discharge.        Left eye: No  discharge.     Extraocular Movements: Extraocular movements intact.     Conjunctiva/sclera: Conjunctivae normal.     Pupils: Pupils are equal, round, and reactive to light.     Comments: Slight erythema of conjunctivae without swelling, discharge or excessive tears  Grossly normal vision  Grossly nl fundi  Cardiovascular:     Rate and Rhythm: Normal rate and regular rhythm.  Pulmonary:     Effort: Pulmonary effort is normal.  Musculoskeletal:     Cervical back: Normal range of motion and neck supple.     Right lower leg: No edema.     Left lower leg: No edema.  Lymphadenopathy:     Cervical: No cervical adenopathy.  Skin:    Findings: No erythema or rash.     Comments: Small superficial wound (2-3 mm) on plantar surface of R foot  No remaining fb seen No erythema or swelling or drainage noted    Neurological:     Mental Status: He is alert.     Cranial Nerves: No cranial nerve deficit.  Psychiatric:        Mood and Affect: Mood normal.          Assessment & Plan:   Problem List Items Addressed This Visit       Other   Eye irritation - Primary    Caused by a facial moisturizer and now much improved after flushing with water and use of artificial tears  Reassuring exam  No vision changes or photophobia   inst to continue use of tears and avoid chemicals around eyes  inst to call if any worse pain or vision change or if this does not continue to improve ER precautions discussed        Splinter of foot    Plantar surface of R foot -wood Looks to be removed  Discussed need for soap and water cleanse abx oint if helpful Watch for s/s of infection  Update if not starting to improve in a week or if worsening

## 2021-07-14 NOTE — Patient Instructions (Signed)
Keep flushing eyes with artificial tears If your eyes start to hurt more let me know  If increased redness or swelling or any vision change or light sensitivity   Keep the wound on your foot clean with soap and water  Antibiotic ointment is fine   Watch for increased pain or redness or swelling   Your tetanus shot is up to date from 2019   Keep [place] posted

## 2021-07-16 NOTE — Assessment & Plan Note (Signed)
Caused by a facial moisturizer and now much improved after flushing with water and use of artificial tears  Reassuring exam  No vision changes or photophobia   inst to continue use of tears and avoid chemicals around eyes  inst to call if any worse pain or vision change or if this does not continue to improve ER precautions discussed

## 2021-07-16 NOTE — Assessment & Plan Note (Signed)
Plantar surface of R foot -wood Looks to be removed  Discussed need for soap and water cleanse abx oint if helpful Watch for s/s of infection  Update if not starting to improve in a week or if worsening

## 2021-08-24 ENCOUNTER — Encounter: Payer: No Typology Code available for payment source | Admitting: Primary Care

## 2021-09-18 ENCOUNTER — Encounter: Payer: Self-pay | Admitting: Family Medicine

## 2021-09-18 ENCOUNTER — Ambulatory Visit (INDEPENDENT_AMBULATORY_CARE_PROVIDER_SITE_OTHER): Payer: No Typology Code available for payment source | Admitting: Family Medicine

## 2021-09-18 DIAGNOSIS — M79652 Pain in left thigh: Secondary | ICD-10-CM | POA: Diagnosis not present

## 2021-09-18 HISTORY — DX: Pain in left thigh: M79.652

## 2021-09-18 NOTE — Progress Notes (Signed)
? ?Subjective:  ? ? Patient ID: Stephen Ramos., male    DOB: 1961/04/02, 60 y.o.   MRN: [id number] ? ?HPI ?61 yo pf of NP Clark presents with hamstring pain  ? ?Wt Readings from Last 3 Encounters:  ?09/18/21 246 lb (111.6 kg)  ?07/14/21 246 lb (111.6 kg)  ?12/08/20 247 lb (112 kg)  ? ?35.30 kg/m? ? ? ?Usually walks and weights/cardio for exercise  ?Reached down to get a barbell  ?She heard a pop and fell down on garage floor / screamed  ?Severe pain : tears, neighbors had to come over  ? ?Lay on the floor for 10 minutes  ?Crawled in the house  ?Excruciating pain /used ice   L hamstring  ? ?Slept on the floor that night / pain got down to 8/10  ?Then progressively better  ?Is walking normally -has not started exercise yet  ? ?? If he got bruised  ?Had a knot there  ?Was very tender  ? ?Did not use heat  ?Took some ibuprofen (small dose)  ? ? ?No ankle changes  ? ?Patient Active Problem List  ? Diagnosis Date Noted  ? Pain in left thigh 09/18/2021  ? Eye irritation 07/14/2021  ? Splinter of foot 07/14/2021  ? Right shoulder pain 06/24/2020  ? Plantar wart of right foot 05/11/2020  ? Acute back pain 09/11/2019  ? Hordeolum internum left lower eyelid 10/11/2016  ? Hyperlipidemia 03/22/2016  ? Prediabetes 03/22/2016  ? Preventative health care 12/27/2014  ? Obesity (BMI 30.0-34.9) 12/15/2014  ? ?Past Medical History:  ?Diagnosis Date  ? Chicken pox   ? Heart murmur 2011  ? Prediabetes   ? Vertigo   ? ?Past Surgical History:  ?Procedure Laterality Date  ? CHOLECYSTECTOMY  2012  ? ?Social History  ? ?Tobacco Use  ? Smoking status: Never  ? Smokeless tobacco: Never  ?Substance Use Topics  ? Alcohol use: No  ?  Alcohol/week: 0.0 standard drinks  ? Drug use: No  ? ?Family History  ?Problem Relation Age of Onset  ? Diabetes Mother   ? Diabetes Father   ? Heart disease Father 10  ?     Myocardial  ? Kidney disease Father   ? Stroke Father   ? Hypertension Father   ? Colon cancer Neg Hx   ? ?No Known Allergies ?Current  Outpatient Medications on File Prior to Visit  ?Medication Sig Dispense Refill  ? MULTIPLE VITAMIN PO Take 1 tablet by mouth every other day.     ? ?No current facility-administered medications on file prior to visit.  ? ? ? ? ?Review of Systems  ?Constitutional:  Negative for activity change, appetite change, fatigue, fever and unexpected weight change.  ?HENT:  Negative for congestion, rhinorrhea, sore throat and trouble swallowing.   ?Eyes:  Negative for pain, redness, itching and visual disturbance.  ?Respiratory:  Negative for cough, chest tightness, shortness of breath and wheezing.   ?Cardiovascular:  Negative for chest pain and palpitations.  ?Gastrointestinal:  Negative for abdominal pain, blood in stool, constipation, diarrhea and nausea.  ?Endocrine: Negative for cold intolerance, heat intolerance, polydipsia and polyuria.  ?Genitourinary:  Negative for difficulty urinating, dysuria, frequency and urgency.  ?Musculoskeletal:  Negative for arthralgias, joint swelling and myalgias.  ?     Pain in posterior R thigh  ?Skin:  Negative for pallor and rash.  ?Neurological:  Negative for dizziness, tremors, weakness, numbness and headaches.  ?Hematological:  Negative for adenopathy. Does not bruise/bleed easily.  ?  Psychiatric/Behavioral:  Negative for decreased concentration and dysphoric mood. The patient is not nervous/anxious.   ? ?   ?Objective:  ? Physical Exam ?Constitutional:   ?   General: He is not in acute distress. ?   Appearance: Normal appearance. He is obese. He is not ill-appearing.  ?Eyes:  ?   Conjunctiva/sclera: Conjunctivae normal.  ?   Pupils: Pupils are equal, round, and reactive to light.  ?Cardiovascular:  ?   Rate and Rhythm: Normal rate and regular rhythm.  ?   Pulses: Normal pulses.  ?Pulmonary:  ?   Effort: Pulmonary effort is normal. No respiratory distress.  ?   Breath sounds: Normal breath sounds.  ?Musculoskeletal:  ?   Cervical back: Neck supple.  ?   Right lower leg: No edema.  ?    Left lower leg: No edema.  ?   Comments: Muscular build  ?Large quadriceps muscles ?L hamstring area is mildly swollen and tender, there is an area of focal swelling w/o warmth, erythema or ecchymosis  ? ?Nl rom of hips  ?Some pain in L post thigh with flex of hip at 50 degrees  ? ?No ankle edema   ?Skin: ?   Coloration: Skin is not pale.  ?   Findings: No bruising, erythema or rash.  ?Neurological:  ?   Mental Status: He is alert.  ?   Sensory: No sensory deficit.  ?   Motor: No weakness.  ?   Gait: Gait normal.  ?Psychiatric:     ?   Mood and Affect: Mood normal.  ? ? ? ? ? ?   ?Assessment & Plan:  ? ?Problem List Items Addressed This Visit   ? ?  ? Other  ? Pain in left thigh  ?  Posterior thigh pain acutely after injury /bending over to lift heavy object  ?Suspect hamstring strain (if tear was partial due to no bruising)  ?Gradual improvement with day to day activity  ?Given handout re: hamstring stain and rehab ?Can start some gentle stretching in a week if further improved  ?Disc imp of warm up before exercise and stretching afterwards  ? ?  ?  ? ? ? ?

## 2021-09-18 NOTE — Assessment & Plan Note (Signed)
Posterior thigh pain acutely after injury /bending over to lift heavy object  ?Suspect hamstring strain (if tear was partial due to no bruising)  ?Gradual improvement with day to day activity  ?Given handout re: hamstring stain and rehab ?Can start some gentle stretching in a week if further improved  ?Disc imp of warm up before exercise and stretching afterwards  ?

## 2021-09-18 NOTE — Patient Instructions (Signed)
Continue ice if you need it  ? ?Warm up with slow walking  ?Then day to day walking  ?Advance as tolerated slowly until you are ready to walk your 2.5 mile  ? ?Do upper body but be careful about bending  ? ? ?Stretch lightly if able  ?If it hurts- hold back  ? ?Let us know if you don't improve   ?

## 2021-11-17 ENCOUNTER — Encounter: Payer: No Typology Code available for payment source | Admitting: Primary Care

## 2022-01-25 ENCOUNTER — Encounter: Payer: No Typology Code available for payment source | Admitting: Primary Care

## 2022-02-17 IMAGING — MR MR SHOULDER*R* W/O CM
4 of 6 series · 26 of 40 positions shown · non-contrast
Comparison: None

CLINICAL DATA: Right shoulder pain extending inferiorly.

EXAM:
MRI OF THE RIGHT SHOULDER WITHOUT CONTRAST
TECHNIQUE: Multiplanar, multisequence MR imaging of the shoulder was performed.
No intravenous contrast was administered.

[Series 6: T2 fat-sat · axial · right · 3.0mm · 0.59mm/px · z∈[-51,+50]mm · 8 of 30 slices shown (1 of 3)]
[im 1/30]
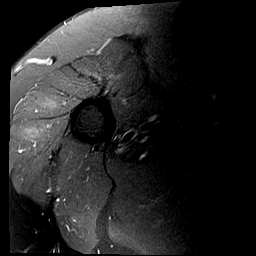
[im 5/30]
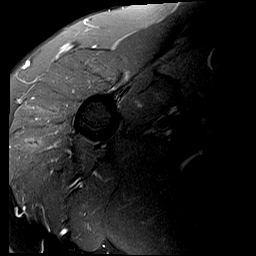
[im 9/30]
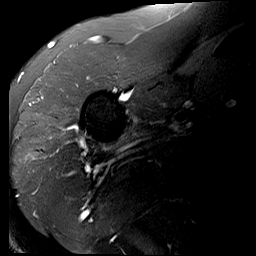
[im 13/30]
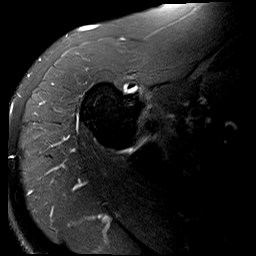
[im 17/30]
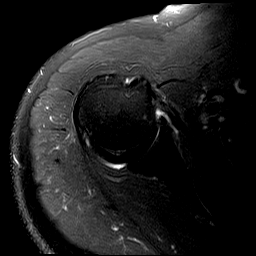
[im 21/30]
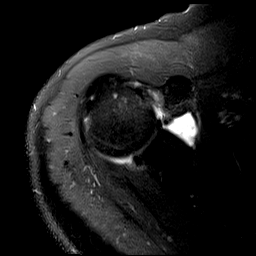
[im 25/30]
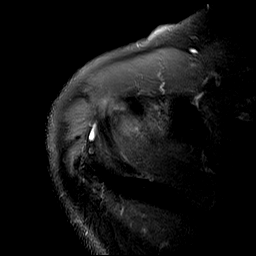
[im 30/30]
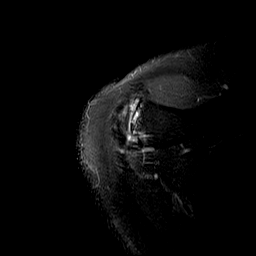

[Series 7: T2 fat-sat · oblique · right · 4.0mm · 0.27mm/px · 5 of 21 slices shown (2 of 3)]
[im 1/21]
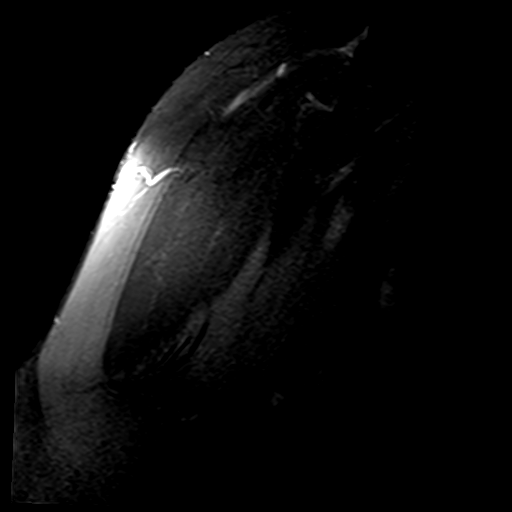
[im 6/21]
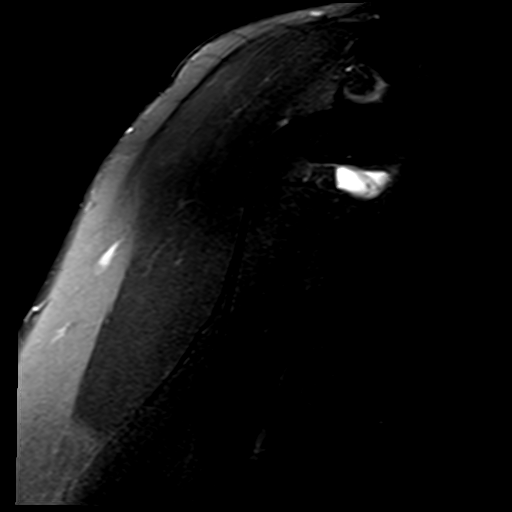
[im 11/21]
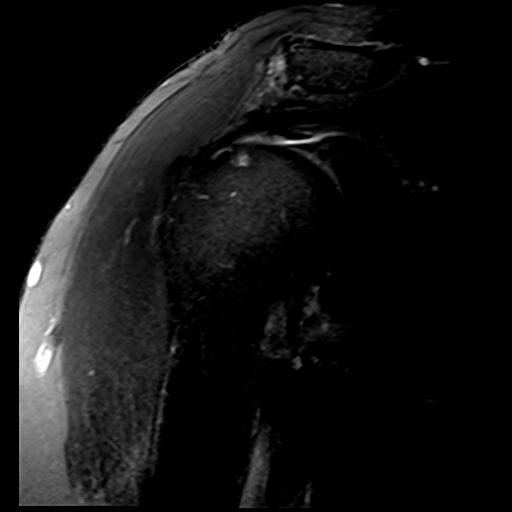
[im 16/21]
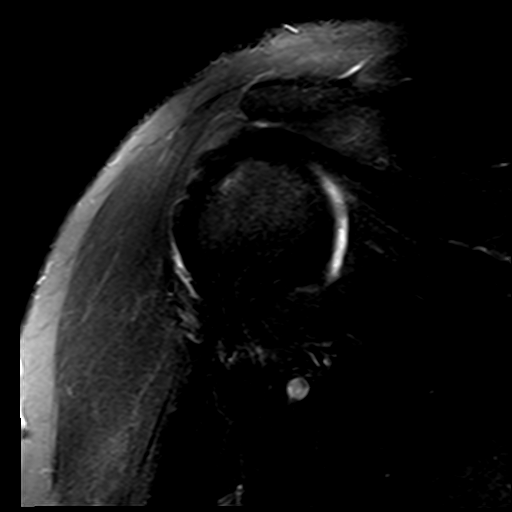
[im 21/21]
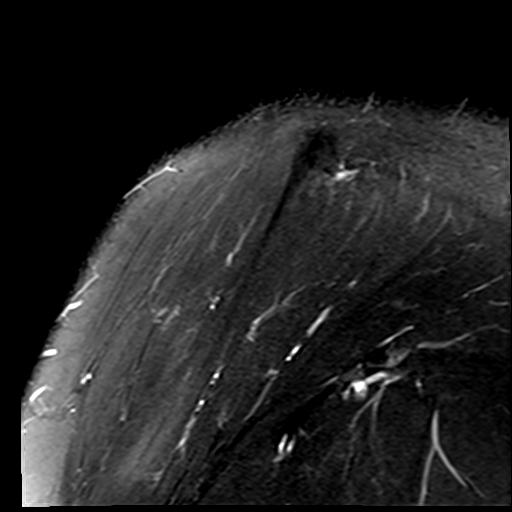

[Series 8: PD · oblique · right · 4.0mm · 0.22mm/px · 6 of 21 slices shown]
[im 1/21]
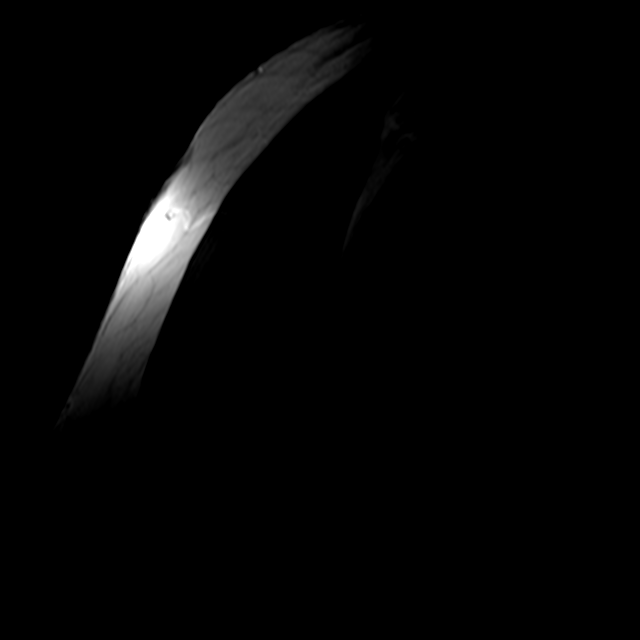
[im 5/21]
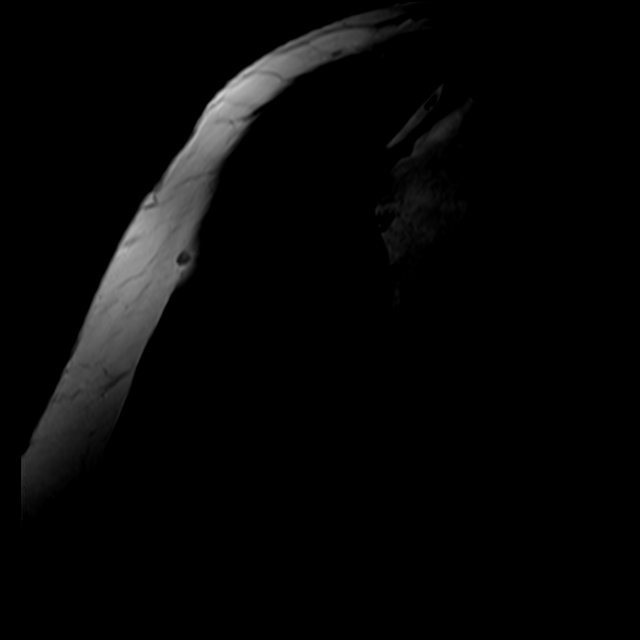
[im 9/21]
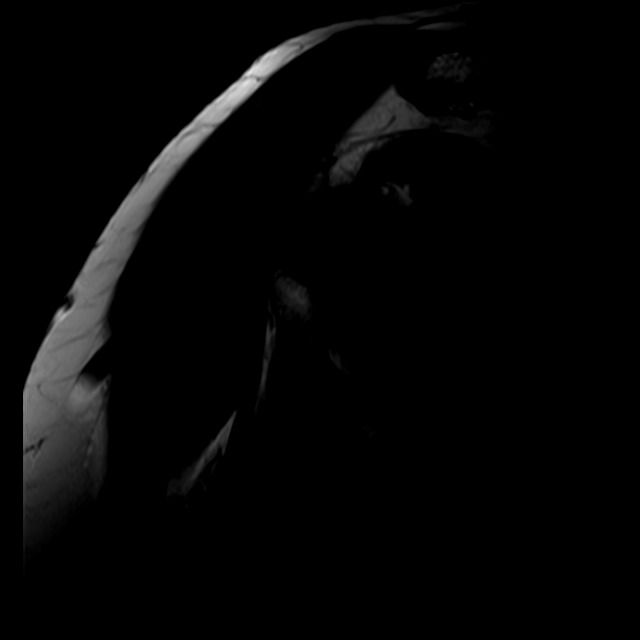
[im 13/21]
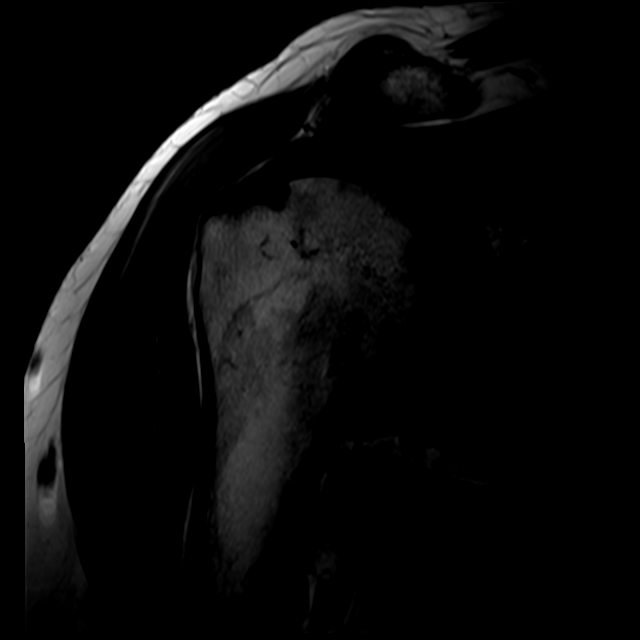
[im 17/21]
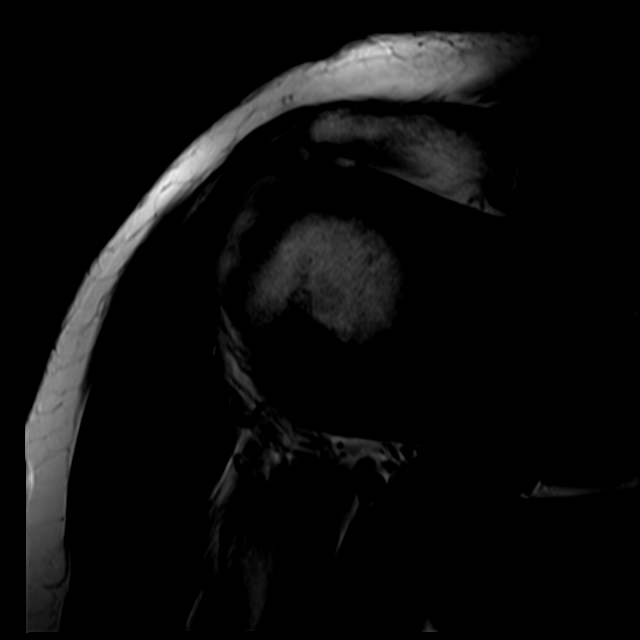
[im 21/21]
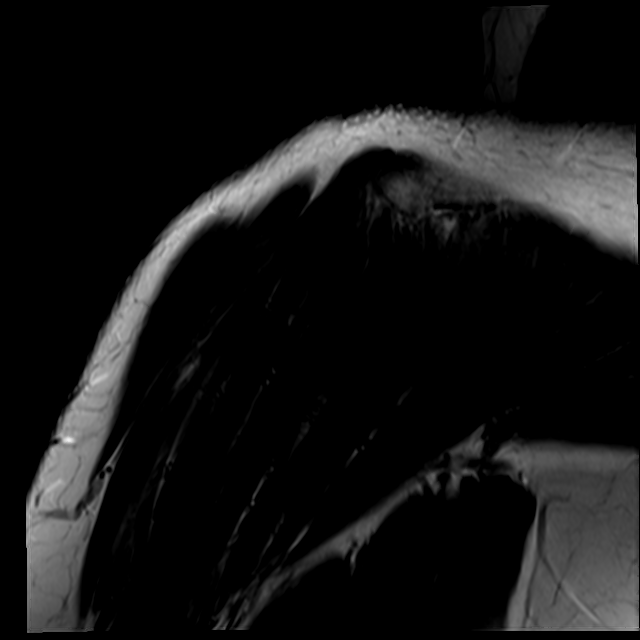

[Series 9: T2 fat-sat · oblique · right · 4.0mm · 0.44mm/px · 7 of 23 slices shown (3 of 3)]
[im 1/23]
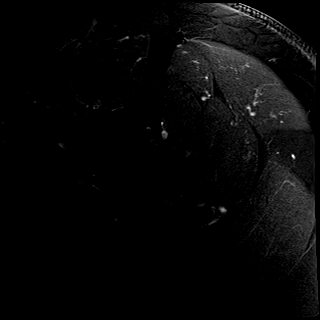
[im 4/23]
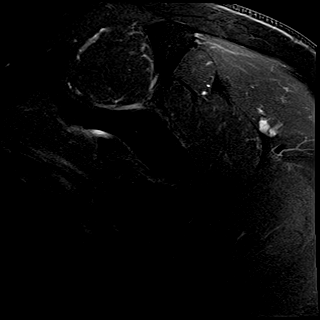
[im 8/23]
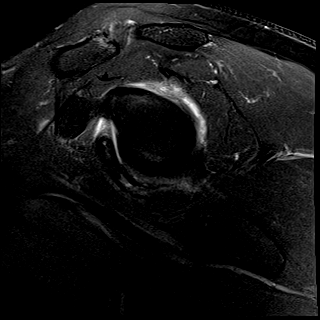
[im 12/23]
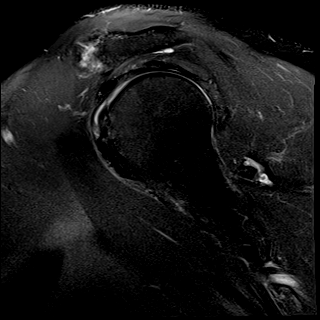
[im 15/23]
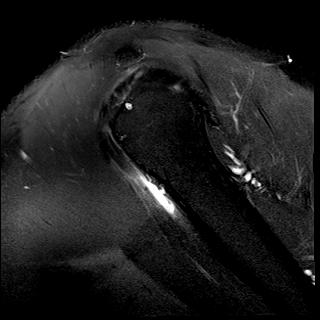
[im 19/23]
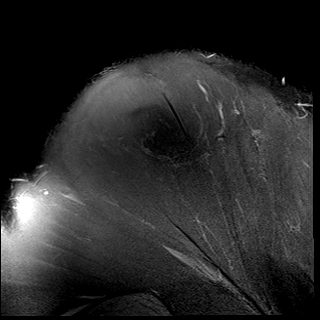
[im 23/23]
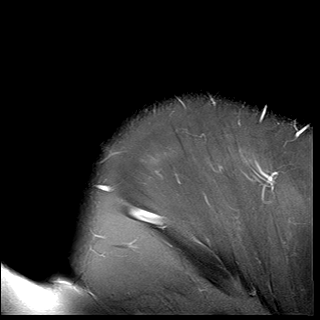

[26 of 40 positions shown; findings below may reference images not displayed]

FINDINGS: Rotator cuff: Mild tendinosis of the supraspinatus tendon with a
small insertional interstitial tear. Mild tendinosis of the
infraspinatus tendon. Teres minor tendon is intact. Subscapularis
tendon is intact.

Muscles: No muscle atrophy or edema. No intramuscular fluid
collection or hematoma.

Biceps Long Head: Mild tendinosis of the intra-articular portion of
the long head of the biceps tendon.

Acromioclavicular Joint: Moderate arthropathy of the
acromioclavicular joint. Type I acromion. No subacromial/subdeltoid
bursal fluid.

Glenohumeral Joint: Small joint effusion. No chondral defect.
Thickening of the inferior glenohumeral ligament as can be seen with
adhesive capsulitis or ligament strain.

Labrum: Grossly intact, but evaluation is limited by lack of
intraarticular fluid/contrast.

Bones: No fracture or dislocation. No aggressive osseous lesion.

Other: No fluid collection or hematoma.
IMPRESSION: 1. Mild tendinosis of the supraspinatus tendon with a small
insertional interstitial tear.
2. Mild tendinosis of the infraspinatus tendon.
3. Thickening of the inferior glenohumeral ligament as can be seen
with adhesive capsulitis or ligament strain.

## 2022-02-20 ENCOUNTER — Encounter: Payer: No Typology Code available for payment source | Admitting: Primary Care

## 2022-03-30 ENCOUNTER — Encounter: Payer: No Typology Code available for payment source | Admitting: Primary Care

## 2022-04-13 ENCOUNTER — Encounter: Payer: Self-pay | Admitting: Primary Care

## 2022-04-13 ENCOUNTER — Ambulatory Visit (INDEPENDENT_AMBULATORY_CARE_PROVIDER_SITE_OTHER): Payer: No Typology Code available for payment source | Admitting: Primary Care

## 2022-04-13 VITALS — BP 112/76 | HR 62 | Temp 97.6°F | Ht 70.0 in | Wt 228.0 lb

## 2022-04-13 DIAGNOSIS — Z0000 Encounter for general adult medical examination without abnormal findings: Secondary | ICD-10-CM | POA: Diagnosis not present

## 2022-04-13 DIAGNOSIS — E782 Mixed hyperlipidemia: Secondary | ICD-10-CM

## 2022-04-13 DIAGNOSIS — R7303 Prediabetes: Secondary | ICD-10-CM | POA: Diagnosis not present

## 2022-04-13 DIAGNOSIS — Z125 Encounter for screening for malignant neoplasm of prostate: Secondary | ICD-10-CM

## 2022-04-13 LAB — COMPREHENSIVE METABOLIC PANEL
ALT: 30 U/L (ref 0–53)
AST: 18 U/L (ref 0–37)
Albumin: 4.8 g/dL (ref 3.5–5.2)
Alkaline Phosphatase: 54 U/L (ref 39–117)
BUN: 23 mg/dL (ref 6–23)
CO2: 28 mEq/L (ref 19–32)
Calcium: 10.3 mg/dL (ref 8.4–10.5)
Chloride: 103 mEq/L (ref 96–112)
Creatinine, Ser: 1.16 mg/dL (ref 0.40–1.50)
GFR: 69.12 mL/min (ref >60.00)
Glucose, Bld: 93 mg/dL (ref 70–99)
Potassium: 4.8 mEq/L (ref 3.5–5.1)
Sodium: 138 mEq/L (ref 135–145)
Total Bilirubin: 0.6 mg/dL (ref 0.2–1.2)
Total Protein: 7.9 g/dL (ref 6.0–8.3)

## 2022-04-13 LAB — CBC
HCT: 47.8 % (ref 39.0–52.0)
Hemoglobin: 15.8 g/dL (ref 13.0–17.0)
MCHC: 33.1 g/dL (ref 30.0–36.0)
MCV: 90.1 fl (ref 78.0–100.0)
Platelets: 232 10*3/uL (ref 150.0–400.0)
RBC: 5.3 Mil/uL (ref 4.22–5.81)
RDW: 13 % (ref 11.5–15.5)
WBC: 5.8 10*3/uL (ref 4.0–10.5)

## 2022-04-13 LAB — LIPID PANEL
Cholesterol: 212 mg/dL — ABNORMAL HIGH (ref 0–200)
HDL: 46.7 mg/dL (ref >39.00)
LDL Cholesterol: 145 mg/dL — ABNORMAL HIGH (ref 0–99)
NonHDL: 165.18
Total CHOL/HDL Ratio: 5
Triglycerides: 103 mg/dL (ref 0.0–149.0)
VLDL: 20.6 mg/dL (ref 0.0–40.0)

## 2022-04-13 LAB — HEMOGLOBIN A1C: Hgb A1c MFr Bld: 5.8 % (ref 4.6–6.5)

## 2022-04-13 LAB — PSA: PSA: 0.86 ng/mL (ref 0.10–4.00)

## 2022-04-13 NOTE — Progress Notes (Signed)
Subjective:    Patient ID: Stephen Fiedler., male    DOB: 1960/12/20, 61 y.o.   MRN: [id number]  HPI  Stephen Dyal. is a very pleasant 62 y.o. male who presents today for complete physical and follow up of chronic conditions.  Immunizations: -Tetanus: 2019 -Influenza: Completed this season  -Shingles: Never completed, declines   Diet: Fair diet.  Exercise: Regular exercise.  Eye exam: Completes annually  Dental exam: Completes semi-annually   Colonoscopy: Completed in 2016, due 2026  PSA: Due  BP Readings from Last 3 Encounters:  04/13/22 112/76  09/18/21 108/60  07/14/21 118/76    Wt Readings from Last 3 Encounters:  04/13/22 228 lb (103.4 kg)  09/18/21 246 lb (111.6 kg)  07/14/21 246 lb (111.6 kg)       Review of Systems  Constitutional:  Negative for unexpected weight change.  HENT:  Negative for rhinorrhea.   Respiratory:  Negative for cough and shortness of breath.   Cardiovascular:  Negative for chest pain.  Gastrointestinal:  Negative for constipation and diarrhea.  Genitourinary:  Negative for difficulty urinating.  Musculoskeletal:  Negative for arthralgias and myalgias.  Skin:  Negative for rash.  Allergic/Immunologic: Negative for environmental allergies.  Neurological:  Negative for dizziness and headaches.  Psychiatric/Behavioral:  The patient is not nervous/anxious.          Past Medical History:  Diagnosis Date   Chicken pox    Heart murmur 2011   Hordeolum internum left lower eyelid 10/11/2016   Pain in left thigh 09/18/2021   Plantar wart of right foot 05/11/2020   Prediabetes    Right shoulder pain 06/24/2020   Vertigo     Social History   Socioeconomic History   Marital status: Single    Spouse name: Not on file   Number of children: Not on file   Years of education: Not on file   Highest education level: Not on file  Occupational History   Not on file  Tobacco Use   Smoking status: Never   Smokeless tobacco:  Never  Substance and Sexual Activity   Alcohol use: No    Alcohol/week: 0.0 standard drinks of alcohol   Drug use: No   Sexual activity: Not on file  Other Topics Concern   Not on file  Social History Narrative   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   Works as a Marine scientist.   Highest level of education is bachelors.   Works for [employer].   Enjoys traveling, reading, collecting coins.   Social Determinants of Health   Financial Resource Strain: Not on file  Food Insecurity: Not on file  Transportation Needs: Not on file  Physical Activity: Not on file  Stress: Not on file  Social Connections: Not on file  Intimate Partner Violence: Not on file    Past Surgical History:  Procedure Laterality Date   CHOLECYSTECTOMY  2012    Family History  Problem Relation Age of Onset   Diabetes Mother    Diabetes Father    Heart disease Father 43       Myocardial   Kidney disease Father    Stroke Father    Hypertension Father    Colon cancer Neg Hx     No Known Allergies  Current Outpatient Medications on File Prior to Visit  Medication Sig Dispense Refill   MULTIPLE VITAMIN PO Take 1 tablet by mouth every other day.      No current facility-administered medications on file prior  to visit.    BP 112/76   Pulse 62   Temp 97.6 F (36.4 C) (Temporal)   Ht '5\' 10"'$  (1.778 m)   Wt 228 lb (103.4 kg)   SpO2 99%   BMI 32.71 kg/m  Objective:   Physical Exam HENT:     Right Ear: Tympanic membrane and ear canal normal.     Left Ear: Tympanic membrane and ear canal normal.     Nose: Nose normal.     Right Sinus: No maxillary sinus tenderness or frontal sinus tenderness.     Left Sinus: No maxillary sinus tenderness or frontal sinus tenderness.  Eyes:     Conjunctiva/sclera: Conjunctivae normal.  Neck:     Thyroid: No thyromegaly.     Vascular: No carotid bruit.  Cardiovascular:     Rate and Rhythm: Normal rate and regular rhythm.     Heart sounds: Normal heart sounds.  Pulmonary:      Effort: Pulmonary effort is normal.     Breath sounds: Normal breath sounds. No wheezing or rales.  Abdominal:     General: Bowel sounds are normal.     Palpations: Abdomen is soft.     Tenderness: There is no abdominal tenderness.  Musculoskeletal:        General: Normal range of motion.     Cervical back: Neck supple.  Skin:    General: Skin is warm and dry.  Neurological:     Mental Status: He is alert and oriented to person, place, and time.     Cranial Nerves: No cranial nerve deficit.     Deep Tendon Reflexes: Reflexes are normal and symmetric.  Psychiatric:        Mood and Affect: Mood normal.           Assessment & Plan:   Problem List Items Addressed This Visit       Other   Preventative health care - Primary    Shingrix due, he declines today. Other vaccines UTD. PSA due and pending. Colonoscopy UTD, due 2026.  Discussed the importance of a healthy diet and regular exercise in order for weight loss, and to reduce the risk of further co-morbidity.  Exam stable. Labs pending.  Follow up in 1 year for repeat physical.       Hyperlipidemia    Commended him on regular exercise and a healthy diet!  Repeat lipid panel pending.      Relevant Orders   Comprehensive metabolic panel   Lipid panel   Prediabetes    Commended him on weight loss through his diet and exercise regimen. Encouraged to continue.  Repeat A1C pending today.       Relevant Orders   Hemoglobin A1c   CBC   Other Visit Diagnoses     Screening for prostate cancer       Relevant Orders   PSA          Pleas Koch, NP

## 2022-04-13 NOTE — Assessment & Plan Note (Signed)
Commended him on weight loss through his diet and exercise regimen. Encouraged to continue.  Repeat A1C pending today.

## 2022-04-13 NOTE — Patient Instructions (Signed)
Stop by the lab prior to leaving today. I will notify you of your results once received.   It was a pleasure to see you today!  Preventive Care 4-59 Years Old, Male Preventive care refers to lifestyle choices and visits with your health care provider that can promote health and wellness. Preventive care visits are also called wellness exams. What can I expect for my preventive care visit? Counseling During your preventive care visit, your health care provider may ask about your: Medical history, including: Past medical problems. Family medical history. Current health, including: Emotional well-being. Home life and relationship well-being. Sexual activity. Lifestyle, including: Alcohol, nicotine or tobacco, and drug use. Access to firearms. Diet, exercise, and sleep habits. Safety issues such as seatbelt and bike helmet use. Sunscreen use. Work and work Statistician. Physical exam Your health care provider will check your: Height and weight. These may be used to calculate your BMI (body mass index). BMI is a measurement that tells if you are at a healthy weight. Waist circumference. This measures the distance around your waistline. This measurement also tells if you are at a healthy weight and may help predict your risk of certain diseases, such as type 2 diabetes and high blood pressure. Heart rate and blood pressure. Body temperature. Skin for abnormal spots. What immunizations do I need?  Vaccines are usually given at various ages, according to a schedule. Your health care provider will recommend vaccines for you based on your age, medical history, and lifestyle or other factors, such as travel or where you work. What tests do I need? Screening Your health care provider may recommend screening tests for certain conditions. This may include: Lipid and cholesterol levels. Diabetes screening. This is done by checking your blood sugar (glucose) after you have not eaten for a while  (fasting). Hepatitis B test. Hepatitis C test. HIV (human immunodeficiency virus) test. STI (sexually transmitted infection) testing, if you are at risk. Lung cancer screening. Prostate cancer screening. Colorectal cancer screening. Talk with your health care provider about your test results, treatment options, and if necessary, the need for more tests. Follow these instructions at home: Eating and drinking  Eat a diet that includes fresh fruits and vegetables, whole grains, lean protein, and low-fat dairy products. Take vitamin and mineral supplements as recommended by your health care provider. Do not drink alcohol if your health care provider tells you not to drink. If you drink alcohol: Limit how much you have to 0-2 drinks a day. Know how much alcohol is in your drink. In the [place] one drink equals one 12 oz bottle of beer (355 mL), one 5 oz glass of wine (148 mL), or one 1 oz glass of hard liquor (44 mL). Lifestyle Brush your teeth every morning and night with fluoride toothpaste. Floss one time each day. Exercise for at least 30 minutes 5 or more days each week. Do not use any products that contain nicotine or tobacco. These products include cigarettes, chewing tobacco, and vaping devices, such as e-cigarettes. If you need help quitting, ask your health care provider. Do not use drugs. If you are sexually active, practice safe sex. Use a condom or other form of protection to prevent STIs. Take aspirin only as told by your health care provider. Make sure that you understand how much to take and what form to take. Work with your health care provider to find out whether it is safe and beneficial for you to take aspirin daily. Find healthy ways to manage  stress, such as: Meditation, yoga, or listening to music. Journaling. Talking to a trusted person. Spending time with friends and family. Minimize exposure to UV radiation to reduce your risk of skin cancer. Safety Always wear  your seat belt while driving or riding in a vehicle. Do not drive: If you have been drinking alcohol. Do not ride with someone who has been drinking. When you are tired or distracted. While texting. If you have been using any mind-altering substances or drugs. Wear a helmet and other protective equipment during sports activities. If you have firearms in your house, make sure you follow all gun safety procedures. What's next? Go to your health care provider once a year for an annual wellness visit. Ask your health care provider how often you should have your eyes and teeth checked. Stay up to date on all vaccines. This information is not intended to replace advice given to you by your health care provider. Make sure you discuss any questions you have with your health care provider. Document Revised: 11/09/2020 Document Reviewed: 11/09/2020 Elsevier Patient Education  [place].

## 2022-04-13 NOTE — Assessment & Plan Note (Signed)
Shingrix due, he declines today. Other vaccines UTD. PSA due and pending. Colonoscopy UTD, due 2026.  Discussed the importance of a healthy diet and regular exercise in order for weight loss, and to reduce the risk of further co-morbidity.  Exam stable. Labs pending.  Follow up in 1 year for repeat physical.

## 2022-04-13 NOTE — Assessment & Plan Note (Signed)
Commended him on regular exercise and a healthy diet!  Repeat lipid panel pending.

## 2022-07-30 ENCOUNTER — Ambulatory Visit (INDEPENDENT_AMBULATORY_CARE_PROVIDER_SITE_OTHER): Payer: No Typology Code available for payment source | Admitting: Family Medicine

## 2022-07-30 ENCOUNTER — Telehealth: Payer: Self-pay

## 2022-07-30 ENCOUNTER — Encounter: Payer: Self-pay | Admitting: Family Medicine

## 2022-07-30 VITALS — BP 124/70 | HR 79 | Temp 97.5°F | Ht 70.0 in | Wt 243.0 lb

## 2022-07-30 DIAGNOSIS — M549 Dorsalgia, unspecified: Secondary | ICD-10-CM | POA: Diagnosis not present

## 2022-07-30 DIAGNOSIS — L609 Nail disorder, unspecified: Secondary | ICD-10-CM | POA: Diagnosis not present

## 2022-07-30 DIAGNOSIS — R519 Headache, unspecified: Secondary | ICD-10-CM | POA: Diagnosis not present

## 2022-07-30 MED ORDER — CYCLOBENZAPRINE HCL 10 MG PO TABS: 5.0000 mg | ORAL_TABLET | Freq: Every day | ORAL | 0 refills | Status: DC | PRN

## 2022-07-30 NOTE — Progress Notes (Unsigned)
Toe Injury Patient dropped a weight on this toe a couple months ago while working out in his garage. Patient states that his nail is starting to get dark. R 4th nail .  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  Pain was prev 6/10, now 2/10.  Less pain early in shift at work.  Some tightness later in the shift.  Midline lower back pain.  No radicular pain.  Has been stretching (ie gradual stretching with toe touches).  No FCNAVD.  No foot drop.  No urinary sx.  Normal sensation in the legs.  H/o sciatica but that doesn't feel similar.   He had HA after eating pork last week. No vision changes.  No focal neuro change.   No symptoms now.  Meds, vitals, and allergies reviewed.   ROS: Per HPI unless specifically indicated in ROS section   GEN: nad, alert and oriented HEENT: ncat NECK: supple w/o LA CV: rrr.  PULM: ctab, no inc wob ABD: soft, +bs EXT: no edema SKIN: no acute rash CN 2-12 wnl B, S/S wnl x4 R4th toenail thicker and darker.  Toe looks normal.  Nail isn't loose.  No bruising or other local skin changes.  Normal capillary refill.

## 2022-07-30 NOTE — Telephone Encounter (Signed)
Per appt notes pt already has appt with Damita Dunnings for 07/30/22 at 11:30. Sending note to Dr Damita Dunnings and Damita Dunnings pool.   [facility] Night - Client TELEPHONE ADVICE RECORD [facility] Patient Name: Stephen Ramos Gender: Male DOB: September 08, 1960 Age: 62 Y 106 M 9 D Return Phone Number: [phone] (Primary) Address: City/ State/ Zip: [place] [address] Client [facility] [place] Night - Client Client Site [name] Provider Alma Friendly - NP Contact Type Call Who Is Calling Patient / Member / Family / Caregiver Call Type Triage / Clinical Relationship To Patient Self Return Phone Number [phone] (Primary) Chief Complaint Back Injury Reason for Call Symptomatic / Request for Health Information Initial Comment Callers states he would like to set a follow up appt. Caller states he also has sprained his back feels very tight. Translation No No Triage Reason Patient declined Nurse Assessment Nurse: Gildardo Pounds, RN, Amy Date/Time Eilene Ghazi Time): 07/30/2022 9:10:47 AM Confirm and document reason for call. If symptomatic, describe symptoms. ---Callers states he has sprained his back. It feels very tight. Does the patient have any new or worsening symptoms? ---Yes Will a triage be completed? ---No Select reason for no triage. ---Patient declined Please document clinical information provided and list any resource used. ---Caller states he called back 20 minutes ago & got an appt. for today. Disp. Time Eilene Ghazi Time) Disposition Final User 07/30/2022 9:12:49 AM Clinical Call Yes Lovelace, RN, Amy Final Disposition 07/30/2022 9:12:49 AM Clinical Call Yes Lovelace, RN, Am

## 2022-07-30 NOTE — Patient Instructions (Addendum)
Keep the nail trimmed.  Use ibuprofen with food as needed.  Use the back exercises/stretches.   Use flexeril as needed.

## 2022-07-31 NOTE — Telephone Encounter (Signed)
See OV note.  Thanks.  

## 2022-08-01 DIAGNOSIS — R519 Headache, unspecified: Secondary | ICD-10-CM | POA: Insufficient documentation

## 2022-08-01 DIAGNOSIS — L609 Nail disorder, unspecified: Secondary | ICD-10-CM | POA: Insufficient documentation

## 2022-08-01 DIAGNOSIS — M549 Dorsalgia, unspecified: Secondary | ICD-10-CM | POA: Insufficient documentation

## 2022-08-01 NOTE — Assessment & Plan Note (Signed)
Could have been related to food/salt (ie pork).  Improved in the meantime.  Normal neurologic exam.  Update [place] as needed.

## 2022-08-01 NOTE — Assessment & Plan Note (Signed)
Discussed options.   Use ibuprofen with food as needed, NSAID cautions discussed with patient. Use the back exercises/stretches.  Handout given to patient.  Discussed. Use flexeril as needed.  Sedation caution. Likely benign strain that should improve.

## 2022-08-01 NOTE — Assessment & Plan Note (Signed)
It looks like he has some chronic changes at the toenail after previous injury but it does not look like it needs intervention now.  Discussed keeping it trimmed and updating [place] as needed.

## 2022-08-02 ENCOUNTER — Ambulatory Visit: Payer: No Typology Code available for payment source | Admitting: Family Medicine

## 2022-08-15 NOTE — Progress Notes (Signed)
    Stephen Baxley T. Arieal Cuoco, MD, CAQ Sports Medicine [facility] at [facility] [address] [place], [address]  Phone: [phone]  FAX: [phone]  Stephen Ramos. - 62 y.o. male  MRN [id number]  Date of Birth: 06-25-1960  Date: 08/16/2022  PCP: Doreene Nest, NP  Referral: Doreene Nest, NP  Chief Complaint  Patient presents with   Back Pain   Subjective:   Stephen Ramos. is a 27 y.o. very pleasant male patient with Body mass index is 34.74 kg/m. who presents with the following:  62 year old patient presents with ongoing back pain.  I have seen him for back pain before 3 years ago.  He complains of back pain in the lower back that is been present for 3 weeks.  He saw my partner Dr. Para March 2 and half weeks ago, and at that point he did give him some home rehab and gave him some Flexeril.  Flexeril made him fairly drowsy, but he is taking a quarter of a tablet at nighttime now for some relief.  He has been diligent with doing his home rehab program.  Right now he thinks he is roughly 90% better compared to when his initial symptoms began.  No radicular pain, numbness, tingling, or weakness.  Review of Systems is noted in the HPI, as appropriate  Objective:   BP 110/62   Pulse 78   Temp (!) 97.4 F (36.3 C) (Temporal)   Ht 5\' 10"  (1.778 m)   Wt 242 lb 2 oz (109.8 kg)   SpO2 100%   BMI 34.74 kg/m    Range of motion at  the waist: Flexion: normal Extension: normal Lateral bending: normal Rotation: all normal  No echymosis or edema Rises to examination table with no difficulty Gait: non antalgic  Inspection/Deformity: N Paraspinus Tenderness: Mild L5-S1  B Ankle Dorsiflexion (L5,4): 5/5 B Great Toe Dorsiflexion (L5,4): 5/5 Heel Walk (L5): WNL Toe Walk (S1): WNL Rise/Squat (L4): WNL  SENSORY B Medial Foot (L4): WNL B Dorsum (L5): WNL B Lateral (S1): WNL Light Touch: WNL  REFLEXES Knee (L4): 2+ Ankle (S1):  2+  B SLR, seated: neg B SLR, supine: neg B FABER: neg B Reverse FABER: neg B Greater Troch: NT B Log Roll: neg B Sciatic Notch: NT   Laboratory and Imaging Data:  Assessment and Plan:     ICD-10-CM   1. Acute bilateral low back pain without sciatica  M54.50      He is already making excellent progress, and I would anticipate he will continue to improve and return to baseline within the next few weeks.  Continue with home rehab, and he will add in some ibuprofen during the day.  Disposition: As needed  Dragon [employer] One speech-to-text software was used for transcription in this dictation.  Possible transcriptional errors can occur using Animal nutritionist.   Signed,  Elpidio Galea. Nysha Koplin, MD   Outpatient Encounter Medications as of 08/16/2022  Medication Sig   cyclobenzaprine (FLEXERIL) 10 MG tablet Take 0.5-1 tablets (5-10 mg total) by mouth daily as needed for muscle spasms (sedation caution).   MULTIPLE VITAMIN PO Take 1 tablet by mouth every other day.    No facility-administered encounter medications on file as of 08/16/2022.

## 2022-08-16 ENCOUNTER — Ambulatory Visit (INDEPENDENT_AMBULATORY_CARE_PROVIDER_SITE_OTHER): Payer: No Typology Code available for payment source | Admitting: Family Medicine

## 2022-08-16 ENCOUNTER — Encounter: Payer: Self-pay | Admitting: Family Medicine

## 2022-08-16 VITALS — BP 110/62 | HR 78 | Temp 97.4°F | Ht 70.0 in | Wt 242.1 lb

## 2022-08-16 DIAGNOSIS — M545 Low back pain, unspecified: Secondary | ICD-10-CM

## 2022-09-23 NOTE — Progress Notes (Unsigned)
    Takeesha Isley T. Miguelangel Korn, MD, CAQ Sports Medicine [facility] at [facility] [address] [place], [address]  Phone: [phone]  FAX: [phone]  Stephen Ramos. - 62 y.o. male  MRN [id number]  Date of Birth: 1961/05/05  Date: 09/24/2022  PCP: Doreene Nest, NP  Referral: Doreene Nest, NP  No chief complaint on file.  Subjective:   Stephen Prins. is a 62 y.o. very pleasant male patient with There is no height or weight on file to calculate BMI. who presents with the following:  Patient presents for follow-up on ongoing acute bilateral low back pain.  I saw him roughly 5 weeks ago, at that point he seems to be doing quite well and was roughly 90% improved from his initial presentation.  At that point I had him continue his home rehab and take some ibuprofen as deleted.  He presents today in follow-up with his ongoing back pain.    Review of Systems is noted in the HPI, as appropriate  Objective:   There were no vitals taken for this visit.  GEN: No acute distress; alert,appropriate. PULM: Breathing comfortably in no respiratory distress PSYCH: Normally interactive.   Laboratory and Imaging Data:  Assessment and Plan:   ***

## 2022-09-24 ENCOUNTER — Encounter: Payer: Self-pay | Admitting: Family Medicine

## 2022-09-24 ENCOUNTER — Ambulatory Visit (INDEPENDENT_AMBULATORY_CARE_PROVIDER_SITE_OTHER): Payer: No Typology Code available for payment source | Admitting: Family Medicine

## 2022-09-24 VITALS — BP 120/60 | HR 94 | Temp 97.7°F | Ht 70.0 in | Wt 253.1 lb

## 2022-09-24 DIAGNOSIS — M545 Low back pain, unspecified: Secondary | ICD-10-CM

## 2022-11-19 ENCOUNTER — Ambulatory Visit: Payer: No Typology Code available for payment source | Admitting: Family Medicine

## 2022-11-26 ENCOUNTER — Ambulatory Visit: Payer: No Typology Code available for payment source | Admitting: Family Medicine

## 2022-11-28 ENCOUNTER — Ambulatory Visit: Payer: No Typology Code available for payment source | Admitting: Family Medicine

## 2022-12-15 NOTE — Progress Notes (Unsigned)
    Nandana Krolikowski T. Evalyne Cortopassi, MD, CAQ Sports Medicine [facility] at [facility] [address] [place], [address]  Phone: [phone]  FAX: [phone]  Stephen Ramos. - 62 y.o. male  MRN [id number]  Date of Birth: September 23, 1960  Date: 12/17/2022  PCP: Doreene Nest, NP  Referral: Doreene Nest, NP  No chief complaint on file.  Subjective:   Stephen Farra. is a 62 y.o. very pleasant male patient with There is no height or weight on file to calculate BMI. who presents with the following:  Patient is here for evaluation of ongoing acute back pain.  I did see him twice this year for some intermittent back pain without radiculopathy, and the last time I saw him he was doing quite a bit better, he described this is 90% better, and his symptoms were almost completely gone.  Did have some occasional back pain at that time.    Review of Systems is noted in the HPI, as appropriate  Objective:   There were no vitals taken for this visit.  GEN: No acute distress; alert,appropriate. PULM: Breathing comfortably in no respiratory distress PSYCH: Normally interactive.   Laboratory and Imaging Data:  Assessment and Plan:   ***

## 2022-12-17 ENCOUNTER — Ambulatory Visit (INDEPENDENT_AMBULATORY_CARE_PROVIDER_SITE_OTHER): Payer: No Typology Code available for payment source | Admitting: Family Medicine

## 2022-12-17 ENCOUNTER — Ambulatory Visit (INDEPENDENT_AMBULATORY_CARE_PROVIDER_SITE_OTHER)
Admission: RE | Admit: 2022-12-17 | Discharge: 2022-12-17 | Disposition: A | Discharge status: Home / Self Care | Payer: No Typology Code available for payment source | Source: Ambulatory Visit | Attending: Family Medicine | Admitting: Family Medicine

## 2022-12-17 ENCOUNTER — Encounter: Payer: Self-pay | Admitting: Family Medicine

## 2022-12-17 VITALS — BP 90/60 | HR 77 | Temp 97.8°F | Ht 70.0 in | Wt 246.2 lb

## 2022-12-17 DIAGNOSIS — G8929 Other chronic pain: Secondary | ICD-10-CM

## 2022-12-17 DIAGNOSIS — M549 Dorsalgia, unspecified: Secondary | ICD-10-CM | POA: Diagnosis not present

## 2022-12-17 MED ORDER — CYCLOBENZAPRINE HCL 10 MG PO TABS: 5.0000 mg | ORAL_TABLET | Freq: Every day | ORAL | 3 refills | Status: DC | PRN

## 2022-12-24 ENCOUNTER — Ambulatory Visit (INDEPENDENT_AMBULATORY_CARE_PROVIDER_SITE_OTHER): Payer: No Typology Code available for payment source | Admitting: Physical Therapy

## 2022-12-24 ENCOUNTER — Ambulatory Visit
Admission: RE | Admit: 2022-12-24 | Discharge: 2022-12-24 | Disposition: A | Discharge status: Home / Self Care | Payer: No Typology Code available for payment source | Source: Ambulatory Visit | Attending: Family Medicine | Admitting: Family Medicine

## 2022-12-24 ENCOUNTER — Encounter: Payer: Self-pay | Admitting: Physical Therapy

## 2022-12-24 ENCOUNTER — Other Ambulatory Visit: Payer: Self-pay

## 2022-12-24 DIAGNOSIS — M5459 Other low back pain: Secondary | ICD-10-CM

## 2022-12-24 DIAGNOSIS — G8929 Other chronic pain: Secondary | ICD-10-CM

## 2022-12-24 DIAGNOSIS — M6281 Muscle weakness (generalized): Secondary | ICD-10-CM | POA: Diagnosis not present

## 2022-12-24 DIAGNOSIS — R29898 Other symptoms and signs involving the musculoskeletal system: Secondary | ICD-10-CM

## 2022-12-24 NOTE — Therapy (Signed)
OUTPATIENT PHYSICAL THERAPY THORACOLUMBAR EVALUATION   Patient Name: Geovonnie Koonz. MRN: [id number] DOB:05-Jan-1961, 62 y.o., male Today's Date: 12/24/2022  END OF SESSION:  PT End of Session - 12/24/22 0815     Visit Number 1    Number of Visits 7    Date for PT Re-Evaluation 02/04/23    Authorization Time Period 12/24/22 to 02/04/23    PT Start Time 0803    PT Stop Time 0843    PT Time Calculation (min) 40 min    Activity Tolerance Patient tolerated treatment well    Behavior During Therapy [facility] for tasks assessed/performed             Past Medical History:  Diagnosis Date   Chicken pox    Heart murmur 2011   Hordeolum internum left lower eyelid 10/11/2016   Pain in left thigh 09/18/2021   Plantar wart of right foot 05/11/2020   Prediabetes    Right shoulder pain 06/24/2020   Vertigo    Past Surgical History:  Procedure Laterality Date   CHOLECYSTECTOMY  2012   Patient Active Problem List   Diagnosis Date Noted   Headache 08/01/2022   Hyperlipidemia 03/22/2016   Prediabetes 03/22/2016   Preventative health care 12/27/2014   Obesity (BMI 30.0-34.9) 12/15/2014    PCP: Vernona Rieger NP   REFERRING PROVIDER: Hannah Beat, MD  REFERRING DIAG: M54.9,G89.29 (ICD-10-CM) - Chronic back pain greater than 3 months duration  Rationale for Evaluation and Treatment: Rehabilitation  THERAPY DIAG:  Other low back pain  Muscle weakness (generalized)  Other symptoms and signs involving the musculoskeletal system  ONSET DATE: chronic >3 months per referral   SUBJECTIVE:                                                                                                                                                                                           SUBJECTIVE STATEMENT:  I'm 18 months from retirement, I have good and bad days but sometimes I feel that nerve-like cool pain, I love to work out a lot in my garage too but sometimes I can't do that. I  plan to retire in an area where we have to walk a lot and right now its hard to do that. I do lift weights in my garage but no ego lifting. 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 Have been doing exercises I found online from PT but this is just nagging- dead bug in quadruped, also 5 minute routine in the mornings with  lateral trunk stretches, lateral hip stretches, windshield wipers, SKTC, brief HS stretch, pelvic tilts, cat cow, child's pose, corpse pose from yoga. Doing TA set on steps helps a lot too.   PERTINENT HISTORY:  Heart murmur, pre DM, vertigo  PAIN:  Are you having pain? Yes: NPRS scale: 2/10 Pain location: low back Pain description: pressure, swelling/inflammation, knot Aggravating factors: bending over, walking  Relieving factors: some exercises, sitting down   Can get to 8/10 at worst   PRECAUTIONS: None  RED FLAGS: None   WEIGHT BEARING RESTRICTIONS: No  FALLS:  Has patient fallen in last 6 months? No  LIVING ENVIRONMENT: Lives with: lives alone Lives in: House/apartment Stairs: does have steps does OK with TA set  Has following equipment at home: None  OCCUPATION: pedatric nurse   PLOF: Independent, Independent with basic ADLs, Independent with gait, and Independent with transfers  PATIENT GOALS: get rid of pain/prevent it from coming back, be able to walk long distances without issue   NEXT MD VISIT: Referring after MRI   OBJECTIVE:   DIAGNOSTIC FINDINGS:  FINDINGS: Five non-rib-bearing lumbar vertebra. Normal lumbar alignment. Normal vertebral body heights. Occasional Schmorl's nodes. The disc spaces are preserved. Suspect mild L3-L4 and L4-L5 facet hypertrophy. No evidence of fracture or focal bone abnormality. The sacroiliac joints are congruent and normal.   IMPRESSION: Suspect mild L3-L4 and L4-L5 facet hypertrophy.    PATIENT SURVEYS:  FOTO  will do at 2nd session   SCREENING FOR RED FLAGS: Bowel or bladder incontinence: No Spinal tumors: No Cauda equina syndrome: No Compression fracture: No Abdominal aneurysm: No  COGNITION: Overall cognitive status: Within functional limits for tasks assessed     SENSATION: Not tested  MUSCLE LENGTH:  Quads mod limitation B Hip flexors mod limitation B per observation Hamstrings mod limitation B Piriformis severe limitation B   POSTURE: rounded shoulders and forward head  PALPATION: Some deep trigger points and muscle spasms upper R glute   LUMBAR ROM:   AROM eval  Flexion WNL, reports RFIS will increase symptoms  Extension WNL, reports REIS has historically improved symptoms   Right lateral flexion Severe limitation   Left lateral flexion Severe limitation   Right rotation Moderate limitation   Left rotation Moderate limitation    (Blank rows = not tested)    LOWER EXTREMITY MMT:    MMT Right eval Left eval  Hip flexion 4+ 4+  Hip extension 3+ 3+  Hip abduction 4+ 4+  Hip adduction    Hip internal rotation    Hip external rotation    Knee flexion 4 4+  Knee extension 5 5  Ankle dorsiflexion 5 5  Ankle plantarflexion    Ankle inversion    Ankle eversion     (Blank rows = not tested)      TODAY'S TREATMENT:  DATE:   Eval  Objective measures, care planning, appropriate education  TherEx  Figure 4 stretch x30 seconds B Bridges x10 Lumbar rotation stretch 5x5 seconds B Lumbar extension at the wall x10 with improvement of symptoms     PATIENT EDUCATION:  Education details: exam findings, anatomy vs findings on xray, HEP, POC  Person educated: Patient Education method: Explanation, Demonstration, and Handouts Education comprehension: verbalized understanding, returned demonstration, and needs further education  HOME  EXERCISE PROGRAM: Access Code: [email or web address] URL: [email or web address] Date: 12/24/2022 Prepared by: Nedra Hai  Exercises - Supine Figure 4 Piriformis Stretch  - 2 x daily - 7 x weekly - 1 sets - 3 reps - 30 seconds  hold - Supine Bridge  - 2 x daily - 7 x weekly - 1 sets - 10 reps - 2 seconds hold - Supine Lower Trunk Rotation  - 2 x daily - 7 x weekly - 1 sets - 5 reps - 5-10 seconds  hold - Standing Lumbar Extension at Wall - Forearms  - 2 x daily - 7 x weekly - 1 sets - 10 reps - 2-3 seconds  hold  ASSESSMENT:  CLINICAL IMPRESSION: Patient is a 62 y.o. M who was seen today for physical therapy evaluation and treatment for ongoing chronic LBP. He has been very active on his own and has been trying some PT exercises from the net, but the pain is nagging and just not improving. Exam reveals impairments as above. Will benefit from skilled PT services to address all symptoms and assist in returning to optimal level of function with minimal risk of re-injury. Pain to 0/10 at EOS.   OBJECTIVE IMPAIRMENTS: decreased mobility, difficulty walking, decreased ROM, decreased strength, hypomobility, increased fascial restrictions, increased muscle spasms, impaired flexibility, improper body mechanics, postural dysfunction, obesity, and pain.   ACTIVITY LIMITATIONS: carrying, lifting, bending, sitting, standing, transfers, bed mobility, locomotion level, and caring for others  PARTICIPATION LIMITATIONS: driving, shopping, community activity, occupation, and yard work  PERSONAL FACTORS: Age, Behavior pattern, Education, Fitness, Past/current experiences, Profession, and Time since onset of injury/illness/exacerbation are also affecting patient's functional outcome.   REHAB POTENTIAL: Good  CLINICAL DECISION MAKING: Stable/uncomplicated  EVALUATION COMPLEXITY: Low   GOALS: Goals reviewed with patient? Yes  SHORT TERM GOALS: Target date: 01/14/2023    Will be compliant with  appropriate progressive HEP  Baseline: Goal status: INITIAL    LONG TERM GOALS: Target date: 02/04/2023    MMT to be 5/5 in all tested groups  Baseline:  Goal status: INITIAL  2.  Lumbar ROM to be no more than 25% impaired all planes of motion Baseline:  Goal status: INITIAL  3.  Hamstring, hip flexor, and piriformis flexibility to have improved by 50%  Baseline:  Goal status: INITIAL  4.  Lumbar pain to be no more than 3/10 at worst  Baseline:  Goal status: INITIAL  5.  Will be able to ambulate unlimited distances without increase from resting pain levels  Baseline:  Goal status: INITIAL  6.  FOTO score to be within 5 points of predicted value  Baseline:  Goal status: INITIAL  PLAN:  PT FREQUENCY: 1x/week  PT DURATION: 6 weeks  PLANNED INTERVENTIONS: Therapeutic exercises, Therapeutic activity, Patient/Family education, Self Care, Joint mobilization, Stair training, Aquatic Therapy, Dry Needling, Electrical stimulation, Spinal mobilization, Cryotherapy, Moist heat, Taping, Ultrasound, Ionotophoresis 4mg /ml Dexamethasone, Manual therapy, and Re-evaluation.  PLAN FOR NEXT SESSION: needs FOTO; focus on extension based exercise program, work on core strength, lumbar flexibility, could  consider DN (he is agreeable)  Nedra Hai, PT, DPT 12/24/22 8:58 AM

## 2022-12-31 ENCOUNTER — Encounter: Payer: Self-pay | Admitting: Physical Therapy

## 2022-12-31 ENCOUNTER — Ambulatory Visit (INDEPENDENT_AMBULATORY_CARE_PROVIDER_SITE_OTHER): Payer: No Typology Code available for payment source | Admitting: Physical Therapy

## 2022-12-31 DIAGNOSIS — R29898 Other symptoms and signs involving the musculoskeletal system: Secondary | ICD-10-CM

## 2022-12-31 DIAGNOSIS — M5459 Other low back pain: Secondary | ICD-10-CM | POA: Diagnosis not present

## 2022-12-31 DIAGNOSIS — M6281 Muscle weakness (generalized): Secondary | ICD-10-CM

## 2022-12-31 NOTE — Therapy (Signed)
OUTPATIENT PHYSICAL THERAPY THORACOLUMBAR TREATMENT   Patient Name: Stephen Ramos. MRN: [id number] DOB:1961/01/07, 62 y.o., male Today's Date: 12/31/2022  END OF SESSION:  PT End of Session - 12/31/22 0804     Visit Number 2    Number of Visits 7    Date for PT Re-Evaluation 02/04/23    Authorization Time Period 12/24/22 to 02/04/23    PT Start Time 0801    PT Stop Time 0841    PT Time Calculation (min) 40 min    Activity Tolerance Patient tolerated treatment well    Behavior During Therapy [facility] for tasks assessed/performed              Past Medical History:  Diagnosis Date   Chicken pox    Heart murmur 2011   Hordeolum internum left lower eyelid 10/11/2016   Pain in left thigh 09/18/2021   Plantar wart of right foot 05/11/2020   Prediabetes    Right shoulder pain 06/24/2020   Vertigo    Past Surgical History:  Procedure Laterality Date   CHOLECYSTECTOMY  2012   Patient Active Problem List   Diagnosis Date Noted   Headache 08/01/2022   Hyperlipidemia 03/22/2016   Prediabetes 03/22/2016   Preventative health care 12/27/2014   Obesity (BMI 30.0-34.9) 12/15/2014    PCP: Vernona Rieger NP   REFERRING PROVIDER: Hannah Beat, MD  REFERRING DIAG: M54.9,G89.29 (ICD-10-CM) - Chronic back pain greater than 3 months duration  Rationale for Evaluation and Treatment: Rehabilitation  THERAPY DIAG:  Other low back pain  Muscle weakness (generalized)  Other symptoms and signs involving the musculoskeletal system  ONSET DATE: chronic >3 months per referral   SUBJECTIVE:                                                                                                                                                                                           SUBJECTIVE STATEMENT:   I'm really feeling a LOT the HEP stretches seemed like they put something back into place in my back. I never got my reading from the MRI. The HEP is amazing, the one where I go  side to side is awesome! I'm back to walking more with the guys in my neighborhood, biggest concern right now is getting my MRI findings and mild pressure in my low back    EVAL:I'm 18 months from retirement, I have good and bad days but sometimes I feel that nerve-like cool pain, I love to work out a lot in my garage too but sometimes I can't do that. I plan to retire in an area where we have to walk a lot  and right now its hard to do that. I do lift weights in my garage but no ego lifting. 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 Have been doing exercises I found online from PT but this is just nagging- dead bug in quadruped, also 5 minute routine in the mornings with lateral trunk stretches, lateral hip stretches, windshield wipers, SKTC, brief HS stretch, pelvic tilts, cat cow, child's pose, corpse pose from yoga. Doing TA set on steps helps a lot too.   PERTINENT HISTORY:  Heart murmur, pre DM, vertigo  PAIN:  Are you having pain? Yes: NPRS scale: 1/10 Pain location: low back Pain description: pressure, swelling/inflammation, knot Aggravating factors: bending over, walking  Relieving factors: some exercises, sitting down   Can get to 8/10 at worst   PRECAUTIONS: None  RED FLAGS: None   WEIGHT BEARING RESTRICTIONS: No  FALLS:  Has patient fallen in last 6 months? No  LIVING ENVIRONMENT: Lives with: lives alone Lives in: House/apartment Stairs: does have steps does OK with TA set  Has following equipment at home: None  OCCUPATION: pedatric nurse   PLOF: Independent, Independent with basic ADLs, Independent with gait, and Independent with transfers  PATIENT GOALS: get rid of pain/prevent it from coming back, be able to walk long distances without issue   NEXT MD VISIT: Referring after MRI   OBJECTIVE:   DIAGNOSTIC FINDINGS:  FINDINGS: Five non-rib-bearing lumbar vertebra.  Normal lumbar alignment. Normal vertebral body heights. Occasional Schmorl's nodes. The disc spaces are preserved. Suspect mild L3-L4 and L4-L5 facet hypertrophy. No evidence of fracture or focal bone abnormality. The sacroiliac joints are congruent and normal.   IMPRESSION: Suspect mild L3-L4 and L4-L5 facet hypertrophy.    PATIENT SURVEYS:  FOTO deferred, huge functional chance/concern current FOTO score would be significantly higher than day 1   SCREENING FOR RED FLAGS: Bowel or bladder incontinence: No Spinal tumors: No Cauda equina syndrome: No Compression fracture: No Abdominal aneurysm: No  COGNITION: Overall cognitive status: Within functional limits for tasks assessed     SENSATION: Not tested  MUSCLE LENGTH:  Quads mod limitation B Hip flexors mod limitation B per observation Hamstrings mod limitation B Piriformis severe limitation B   POSTURE: rounded shoulders and forward head  PALPATION: Some deep trigger points and muscle spasms upper R glute   LUMBAR ROM:   AROM eval  Flexion WNL, reports RFIS will increase symptoms  Extension WNL, reports REIS has historically improved symptoms   Right lateral flexion Severe limitation   Left lateral flexion Severe limitation   Right rotation Moderate limitation   Left rotation Moderate limitation    (Blank rows = not tested)    LOWER EXTREMITY MMT:    MMT Right eval Left eval  Hip flexion 4+ 4+  Hip extension 3+ 3+  Hip abduction 4+ 4+  Hip adduction    Hip internal rotation    Hip external rotation    Knee flexion 4 4+  Knee extension 5 5  Ankle dorsiflexion 5 5  Ankle plantarflexion    Ankle inversion    Ankle eversion     (Blank rows = not tested)      TODAY'S TREATMENT:  DATE:   12/31/22   TherEx  Nustep L5 x6 minutes PPT x15 with 3 second holds  PPT + march  x12 PPT + limited ROM SLR x10 B Quadruped TA set x15  Quadruped TA set + alternating UEs x10 B Quadruped TA set + alternating LEs x10 B Bird dogs + TA set x10 B Quadruped fire hydrants x10 B no resistance  Quadruped hip extensions x10 B no resistance  Lumbar rotation stretches 5x5 seconds B  Lumbar SKTC 5x5 seconds B Piriformis stretch 1x30 seconds B     Eval  Objective measures, care planning, appropriate education  TherEx  Figure 4 stretch x30 seconds B Bridges x10 Lumbar rotation stretch 5x5 seconds B Lumbar extension at the wall x10 with improvement of symptoms     PATIENT EDUCATION:  Education details: exam findings, anatomy vs findings on xray, HEP, POC  Person educated: Patient Education method: Explanation, Demonstration, and Handouts Education comprehension: verbalized understanding, returned demonstration, and needs further education  HOME EXERCISE PROGRAM:  Access Code: [email or web address]- last update 12/31/22 URL: [email or web address] Date: 12/31/2022 Prepared by: Nedra Hai  Exercises - Supine Figure 4 Piriformis Stretch  - 2 x daily - 7 x weekly - 1 sets - 3 reps - 30 seconds  hold - Supine Bridge  - 2 x daily - 7 x weekly - 1 sets - 10 reps - 2 seconds hold - Supine Lower Trunk Rotation  - 2 x daily - 7 x weekly - 1 sets - 5 reps - 5-10 seconds  hold - Standing Lumbar Extension at Wall - Forearms  - 2 x daily - 7 x weekly - 1 sets - 10 reps - 2-3 seconds  hold - Supine Posterior Pelvic Tilt  - 2 x daily - 7 x weekly - 1 sets - 10 reps - 3 seconds  hold - Supine March with Posterior Pelvic Tilt  - 2 x daily - 7 x weekly - 1 sets - 10 reps - Small Range SLR + Posterior Pelvic Tilt   - 2 x daily - 7 x weekly - 1 sets - 10 reps - Curl Up with Reach  - 2 x daily - 7 x weekly - 1 sets - 10 reps  ASSESSMENT:  CLINICAL IMPRESSION:  Imam arrives today really feeling much better, his HEP is really helping and he has already gotten back to a lot of  activities recently. We progressed all exercises and activities as able and tolerated this session. Remains compliant with HEP, really making great progress already and remaining motivated! Might be able to go on hold with HEP after next visit.    OBJECTIVE IMPAIRMENTS: decreased mobility, difficulty walking, decreased ROM, decreased strength, hypomobility, increased fascial restrictions, increased muscle spasms, impaired flexibility, improper body mechanics, postural dysfunction, obesity, and pain.   ACTIVITY LIMITATIONS: carrying, lifting, bending, sitting, standing, transfers, bed mobility, locomotion level, and caring for others  PARTICIPATION LIMITATIONS: driving, shopping, community activity, occupation, and yard work  PERSONAL FACTORS: Age, Behavior pattern, Education, Fitness, Past/current experiences, Profession, and Time since onset of injury/illness/exacerbation are also affecting patient's functional outcome.   REHAB POTENTIAL: Good  CLINICAL DECISION MAKING: Stable/uncomplicated  EVALUATION COMPLEXITY: Low   GOALS: Goals reviewed with patient? Yes  SHORT TERM GOALS: Target date: 01/14/2023    Will be compliant with appropriate progressive HEP  Baseline: Goal status: INITIAL    LONG TERM GOALS: Target date: 02/04/2023    MMT to be 5/5 in all tested groups  Baseline:  Goal status:  INITIAL  2.  Lumbar ROM to be no more than 25% impaired all planes of motion Baseline:  Goal status: INITIAL  3.  Hamstring, hip flexor, and piriformis flexibility to have improved by 50%  Baseline:  Goal status: INITIAL  4.  Lumbar pain to be no more than 3/10 at worst  Baseline:  Goal status: INITIAL  5.  Will be able to ambulate unlimited distances without increase from resting pain levels  Baseline:  Goal status: INITIAL  6.  FOTO score to be within 5 points of predicted value  Baseline:  Goal status: INITIAL  PLAN:  PT FREQUENCY: 1x/week  PT DURATION: 6  weeks  PLANNED INTERVENTIONS: Therapeutic exercises, Therapeutic activity, Patient/Family education, Self Care, Joint mobilization, Stair training, Aquatic Therapy, Dry Needling, Electrical stimulation, Spinal mobilization, Cryotherapy, Moist heat, Taping, Ultrasound, Ionotophoresis 4mg /ml Dexamethasone, Manual therapy, and Re-evaluation.  PLAN FOR NEXT SESSION: focus on extension based exercise program, work on core strength, lumbar flexibility, could consider DN (he is agreeable). Continue with regular HEP updates, possibly go on hold VS formal DC after next visit?   Nedra Hai, PT, DPT 12/31/22 8:41 AM

## 2023-01-07 ENCOUNTER — Ambulatory Visit (INDEPENDENT_AMBULATORY_CARE_PROVIDER_SITE_OTHER): Payer: No Typology Code available for payment source | Admitting: Physical Therapy

## 2023-01-07 ENCOUNTER — Encounter: Payer: Self-pay | Admitting: Physical Therapy

## 2023-01-07 DIAGNOSIS — R29898 Other symptoms and signs involving the musculoskeletal system: Secondary | ICD-10-CM | POA: Diagnosis not present

## 2023-01-07 DIAGNOSIS — M6281 Muscle weakness (generalized): Secondary | ICD-10-CM

## 2023-01-07 DIAGNOSIS — M5459 Other low back pain: Secondary | ICD-10-CM | POA: Diagnosis not present

## 2023-01-07 NOTE — Therapy (Signed)
OUTPATIENT PHYSICAL THERAPY THORACOLUMBAR TREATMENT DISCHARGE   Patient Name: Stephen Ramos. MRN: [id number] DOB:[birth year], 62 y.o., male Today's Date: 01/07/2023  END OF SESSION:  PT End of Session - 01/07/23 0909     Visit Number 3    Number of Visits 7    Date for PT Re-Evaluation 02/04/23    Authorization Time Period 12/24/22 to 02/04/23    PT Start Time 0803    PT Stop Time 0850    PT Time Calculation (min) 47 min    Activity Tolerance Patient tolerated treatment well    Behavior During Therapy [facility] for tasks assessed/performed               Past Medical History:  Diagnosis Date   Chicken pox    Heart murmur 2011   Hordeolum internum left lower eyelid 10/11/2016   Pain in left thigh 09/18/2021   Plantar wart of right foot 05/11/2020   Prediabetes    Right shoulder pain 06/24/2020   Vertigo    Past Surgical History:  Procedure Laterality Date   CHOLECYSTECTOMY  2012   Patient Active Problem List   Diagnosis Date Noted   Headache 08/01/2022   Hyperlipidemia 03/22/2016   Prediabetes 03/22/2016   Preventative health care 12/27/2014   Obesity (BMI 30.0-34.9) 12/15/2014    PCP: Vernona Rieger NP   REFERRING PROVIDER: Hannah Beat, MD  REFERRING DIAG: M54.9,G89.29 (ICD-10-CM) - Chronic back pain greater than 3 months duration  Rationale for Evaluation and Treatment: Rehabilitation  THERAPY DIAG:  Other low back pain  Muscle weakness (generalized)  Other symptoms and signs involving the musculoskeletal system  ONSET DATE: chronic >3 months per referral   SUBJECTIVE:                                                                                                                                                                                           SUBJECTIVE STATEMENT:   Pt arriving to therapy reporting feeling much better. Pt is now exercises in the afternoon and is currently not having any pain at rest.    EVAL:I'm 18 months from  retirement, I have good and bad days but sometimes I feel that nerve-like cool pain, I love to work out a lot in my garage too but sometimes I can't do that. I plan to retire in an area where we have to walk a lot and right now its hard to do that. I do lift weights in my garage but no ego lifting. 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 Have been doing exercises I found online from PT but this is just nagging- dead bug in quadruped, also 5 minute routine in the mornings with lateral trunk stretches, lateral hip stretches, windshield wipers, SKTC, brief HS stretch, pelvic tilts, cat cow, child's pose, corpse pose from yoga. Doing TA set on steps helps a lot too.   PERTINENT HISTORY:  Heart murmur, pre DM, vertigo  PAIN:  Are you having pain? no    PRECAUTIONS: None  RED FLAGS: None   WEIGHT BEARING RESTRICTIONS: No  FALLS:  Has patient fallen in last 6 months? No  LIVING ENVIRONMENT: Lives with: lives alone Lives in: House/apartment Stairs: does have steps does OK with TA set  Has following equipment at home: None  OCCUPATION: pedatric nurse   PLOF: Independent, Independent with basic ADLs, Independent with gait, and Independent with transfers  PATIENT GOALS: get rid of pain/prevent it from coming back, be able to walk long distances without issue   NEXT MD VISIT: Referring after MRI   OBJECTIVE:   DIAGNOSTIC FINDINGS:  FINDINGS: Five non-rib-bearing lumbar vertebra. Normal lumbar alignment. Normal vertebral body heights. Occasional Schmorl's nodes. The disc spaces are preserved. Suspect mild L3-L4 and L4-L5 facet hypertrophy. No evidence of fracture or focal bone abnormality. The sacroiliac joints are congruent and normal.   IMPRESSION: Suspect mild L3-L4 and L4-L5 facet hypertrophy.    PATIENT SURVEYS:  FOTO deferred, huge functional chance/concern current FOTO score  would be significantly higher than day 1   SCREENING FOR RED FLAGS: Bowel or bladder incontinence: No Spinal tumors: No Cauda equina syndrome: No Compression fracture: No Abdominal aneurysm: No  COGNITION: Overall cognitive status: Within functional limits for tasks assessed     SENSATION: Not tested  MUSCLE LENGTH:  Quads mod limitation B Hip flexors mod limitation B per observation Hamstrings mod limitation B Piriformis severe limitation B   POSTURE: rounded shoulders and forward head  PALPATION: Some deep trigger points and muscle spasms upper R glute   LUMBAR ROM:   AROM eval 01/07/23  Flexion WNL, reports RFIS will increase symptoms WFL No pain reported  Extension WNL, reports REIS has historically improved symptoms  WFL No pain reported  Right lateral flexion Severe limitation  WFL No pain reported  Left lateral flexion Severe limitation  WFL No pain reported  Right rotation Moderate limitation  WFL No pain reported  Left rotation Moderate limitation  WFL No pain reported   (Blank rows = not tested)    LOWER EXTREMITY MMT:    MMT Right eval Left eval 01/07/23 Rt / Left  Hip flexion 4+ 4+ 5 / 5  Hip extension 3+ 3+ 5 / 5  Hip abduction 4+ 4+ 5 / 5  Hip adduction     Hip internal rotation     Hip external rotation     Knee flexion 4 4+ 5 / 5  Knee extension 5 5 5  / 5  Ankle dorsiflexion 5 5 5  / 5  Ankle plantarflexion     Ankle inversion     Ankle eversion      (Blank rows = not tested)      TODAY'S TREATMENT:  DATE:  01/07/23 TherEx:  Nustep: level 5 x 5 minutes Standing hip extension: x 15 bil LE Standing hip flexor stretch x 3 bil LE holding 30 sec Seated hamstring stretch x 3 bil LE holidng 30 sec Quadraped: bird dog x 5 bil holding 10 sec Quadraped: thoracic rotation: "thread the needle" x 5 bil holding 10 sec  each position Reviewed all other HEP and updated pt's program MMT performed  FOTO preformed   12/31/22 TherEx  Nustep L5 x6 minutes PPT x15 with 3 second holds  PPT + march x12 PPT + limited ROM SLR x10 B Quadruped TA set x15  Quadruped TA set + alternating UEs x10 B Quadruped TA set + alternating LEs x10 B Bird dogs + TA set x10 B Quadruped fire hydrants x10 B no resistance  Quadruped hip extensions x10 B no resistance  Lumbar rotation stretches 5x5 seconds B  Lumbar SKTC 5x5 seconds B Piriformis stretch 1x30 seconds B     Eval  Objective measures, care planning, appropriate education  TherEx  Figure 4 stretch x30 seconds B Bridges x10 Lumbar rotation stretch 5x5 seconds B Lumbar extension at the wall x10 with improvement of symptoms     PATIENT EDUCATION:  Education details: exam findings, anatomy vs findings on xray, HEP, POC  Person educated: Patient Education method: Explanation, Demonstration, and Handouts Education comprehension: verbalized understanding, returned demonstration, and needs further education  HOME EXERCISE PROGRAM: Access Code: [email or web address] URL: [email or web address] Date: 01/07/2023 Prepared by: Narda Amber  Exercises - Supine Figure 4 Piriformis Stretch  - 2 x daily - 7 x weekly - 1 sets - 3 reps - 30 seconds  hold - Supine Bridge  - 2 x daily - 7 x weekly - 1 sets - 10 reps - 2 seconds hold - Supine Lower Trunk Rotation  - 2 x daily - 7 x weekly - 1 sets - 5 reps - 5-10 seconds  hold - Standing Lumbar Extension at Wall - Forearms  - 2 x daily - 7 x weekly - 1 sets - 10 reps - 2-3 seconds  hold - Supine Posterior Pelvic Tilt  - 2 x daily - 7 x weekly - 1 sets - 10 reps - 3 seconds  hold - Supine March with Posterior Pelvic Tilt  - 2 x daily - 7 x weekly - 1 sets - 10 reps - Small Range SLR + Posterior Pelvic Tilt   - 2 x daily - 7 x weekly - 1 sets - 10 reps - Curl Up with Reach  - 2 x daily - 7 x weekly - 1 sets - 10  reps - Standing Hip Extension with Counter Support  - 1 x daily - 7 x weekly - 2 sets - 10 reps - Seated Hamstring Stretch  - 1 x daily - 7 x weekly - 3 reps - 30 seconds hold - Standing Hip Flexor Stretch  - 1 x daily - 7 x weekly - 3 reps - 30 seconds hold - Bird Dog  - 1 x daily - 7 x weekly - 10 reps - 5-10 seconds hold - Cat Cow  - 1 x daily - 7 x weekly - 5 reps - 5-10 seconds hold - Quadruped Thoracic Rotation - Reach Under  - 1 x daily - 7 x weekly - 5 reps - 5-10 seconds hold    ASSESSMENT:  CLINICAL IMPRESSION: Pt has been doing well with his initial HEP and reporting no pain after initiating initial stretches. Pt  has met all of his STG's and LTG's. Pt's HEP was updated and pt edu in continued stretching after period of driving or sitting more than 30 minutes. Pt is being discharged today with no further need for skilled PT interventions.     OBJECTIVE IMPAIRMENTS: decreased mobility, difficulty walking, decreased ROM, decreased strength, hypomobility, increased fascial restrictions, increased muscle spasms, impaired flexibility, improper body mechanics, postural dysfunction, obesity, and pain.   ACTIVITY LIMITATIONS: carrying, lifting, bending, sitting, standing, transfers, bed mobility, locomotion level, and caring for others  PARTICIPATION LIMITATIONS: driving, shopping, community activity, occupation, and yard work  PERSONAL FACTORS: Age, Behavior pattern, Education, Fitness, Past/current experiences, Profession, and Time since onset of injury/illness/exacerbation are also affecting patient's functional outcome.   REHAB POTENTIAL: Good  CLINICAL DECISION MAKING: Stable/uncomplicated  EVALUATION COMPLEXITY: Low   GOALS: Goals reviewed with patient? Yes  SHORT TERM GOALS: Target date: 01/14/2023    Will be compliant with appropriate progressive HEP  Baseline: Goal status: MET 01/07/23    LONG TERM GOALS: Target date: 02/04/2023    MMT to be 5/5 in all tested  groups  Baseline:  Goal status: MET 01/07/23  2.  Lumbar ROM to be no more than 25% impaired all planes of motion Baseline:  Goal status: MET8/12/24  3.  Hamstring, hip flexor, and piriformis flexibility to have improved by 50%  Baseline:  Goal status: MET 01/07/23  4.  Lumbar pain to be no more than 3/10 at worst  Baseline:  Goal status: MET 01/07/23  5.  Will be able to ambulate unlimited distances without increase from resting pain levels  Baseline:  Goal status: MET 01/07/23  6.  FOTO score to be within 5 points of predicted value  Baseline: 55% Goal status: MET 01/07/23: 94%  PLAN:  PT FREQUENCY: 1x/week  PT DURATION: 6 weeks  PLANNED INTERVENTIONS: Therapeutic exercises, Therapeutic activity, Patient/Family education, Self Care, Joint mobilization, Stair training, Aquatic Therapy, Dry Needling, Electrical stimulation, Spinal mobilization, Cryotherapy, Moist heat, Taping, Ultrasound, Ionotophoresis 4mg /ml Dexamethasone, Manual therapy, and Re-evaluation.  PLAN FOR NEXT SESSION: Discharged   01/07/23 9:13 AM PHYSICAL THERAPY DISCHARGE SUMMARY  Visits from Start of Care: 3  Current functional level related to goals / functional outcomes: See above   Remaining deficits: See above   Education / Equipment: HEP, posture, exercise techniques   Patient agrees to discharge. Patient goals were met. Patient is being discharged due to meeting the stated rehab goals.

## 2023-01-10 ENCOUNTER — Encounter (INDEPENDENT_AMBULATORY_CARE_PROVIDER_SITE_OTHER): Payer: Self-pay

## 2023-01-14 ENCOUNTER — Encounter: Payer: No Typology Code available for payment source | Admitting: Physical Therapy

## 2023-01-14 ENCOUNTER — Ambulatory Visit: Payer: No Typology Code available for payment source | Admitting: Family Medicine

## 2023-01-16 ENCOUNTER — Ambulatory Visit (INDEPENDENT_AMBULATORY_CARE_PROVIDER_SITE_OTHER): Payer: No Typology Code available for payment source | Admitting: Family Medicine

## 2023-01-16 ENCOUNTER — Encounter: Payer: Self-pay | Admitting: Family Medicine

## 2023-01-16 VITALS — BP 104/66 | HR 74 | Temp 97.7°F | Ht 70.0 in | Wt 241.5 lb

## 2023-01-16 DIAGNOSIS — R110 Nausea: Secondary | ICD-10-CM | POA: Insufficient documentation

## 2023-01-16 DIAGNOSIS — R7303 Prediabetes: Secondary | ICD-10-CM

## 2023-01-16 LAB — CBC WITH DIFFERENTIAL/PLATELET
Basophils Absolute: 0 10*3/uL (ref 0.0–0.1)
Basophils Relative: 0.4 % (ref 0.0–3.0)
Eosinophils Absolute: 0.1 10*3/uL (ref 0.0–0.7)
Eosinophils Relative: 1.6 % (ref 0.0–5.0)
HCT: 44.4 % (ref 39.0–52.0)
Hemoglobin: 14.7 g/dL (ref 13.0–17.0)
Lymphocytes Relative: 33.6 % (ref 12.0–46.0)
Lymphs Abs: 2.2 10*3/uL (ref 0.7–4.0)
MCHC: 33.2 g/dL (ref 30.0–36.0)
MCV: 90.3 fl (ref 78.0–100.0)
Monocytes Absolute: 0.5 10*3/uL (ref 0.1–1.0)
Monocytes Relative: 7.5 % (ref 3.0–12.0)
Neutro Abs: 3.7 10*3/uL (ref 1.4–7.7)
Neutrophils Relative %: 56.9 % (ref 43.0–77.0)
Platelets: 219 10*3/uL (ref 150.0–400.0)
RBC: 4.92 Mil/uL (ref 4.22–5.81)
RDW: 12.9 % (ref 11.5–15.5)
WBC: 6.5 10*3/uL (ref 4.0–10.5)

## 2023-01-16 LAB — HEPATIC FUNCTION PANEL
ALT: 50 U/L (ref 0–53)
AST: 36 U/L (ref 0–37)
Albumin: 4.4 g/dL (ref 3.5–5.2)
Alkaline Phosphatase: 50 U/L (ref 39–117)
Bilirubin, Direct: 0.1 mg/dL (ref 0.0–0.3)
Total Bilirubin: 0.5 mg/dL (ref 0.2–1.2)
Total Protein: 7.6 g/dL (ref 6.0–8.3)

## 2023-01-16 LAB — BASIC METABOLIC PANEL
BUN: 20 mg/dL (ref 6–23)
CO2: 28 mEq/L (ref 19–32)
Calcium: 9.8 mg/dL (ref 8.4–10.5)
Chloride: 104 mEq/L (ref 96–112)
Creatinine, Ser: 1.09 mg/dL (ref 0.40–1.50)
GFR: 74.08 mL/min (ref >60.00)
Glucose, Bld: 103 mg/dL — ABNORMAL HIGH (ref 70–99)
Potassium: 4.5 mEq/L (ref 3.5–5.1)
Sodium: 138 mEq/L (ref 135–145)

## 2023-01-16 LAB — HEMOGLOBIN A1C: Hgb A1c MFr Bld: 5.8 % (ref 4.6–6.5)

## 2023-01-16 MED ORDER — PROMETHAZINE HCL 25 MG PO TABS: 12.5000 mg | ORAL_TABLET | Freq: Three times a day (TID) | ORAL | 0 refills | Status: DC | PRN

## 2023-01-16 NOTE — Patient Instructions (Addendum)
Sip fluids  Advance diet slowly - bland foods  BRAT (bananas,rice, apple sauce, toast Get rest when you can   Try the phenergan for nausea and dizziness   Labs today    Update if not starting to improve in a week or if worsening

## 2023-01-16 NOTE — Progress Notes (Signed)
Subjective:    Patient ID: Pixie Casino., male    DOB: August 11, 1960, 62 y.o.   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)  No body aches  No chills   Nausea- took pepto  Never vomited  No abd pain   Little diarrhea  Stopped eating and took pepto  No blood in stool   Forced fluids the whole time  Ate a little wheat bread    Improved but not 100%    History of vertigo  Peanuts and dairy products trigger it    Patient Active Problem List   Diagnosis Date Noted   Nausea 01/16/2023   Headache 08/01/2022   Hyperlipidemia 03/22/2016   Prediabetes 03/22/2016   Preventative health care 12/27/2014   Obesity (BMI 30.0-34.9) 12/15/2014   Past Medical History:  Diagnosis Date   Chicken pox    Heart murmur 2011   Hordeolum internum left lower eyelid 10/11/2016   Pain in left thigh 09/18/2021   Plantar wart of right foot 05/11/2020   Prediabetes    Right shoulder pain 06/24/2020   Vertigo    Past Surgical History:  Procedure Laterality Date   CHOLECYSTECTOMY  2012   Social History   Tobacco Use   Smoking status: Never   Smokeless tobacco: Never  Substance Use Topics   Alcohol use: No    Alcohol/week: 0.0 standard drinks of alcohol   Drug use: No   Family History  Problem Relation Age of Onset   Diabetes Mother    Diabetes Father    Heart disease  Father 34       Myocardial   Kidney disease Father    Stroke Father    Hypertension Father    Colon cancer Neg Hx    No Known Allergies Current Outpatient Medications on File Prior to Visit  Medication Sig Dispense Refill   cyclobenzaprine (FLEXERIL) 10 MG tablet Take 0.5-1 tablets (5-10 mg total) by mouth daily as needed for muscle spasms (sedation caution). 30 tablet 3   MULTIPLE VITAMIN PO Take 1 tablet by mouth every other day.      No current facility-administered medications on file prior to visit.    Review of Systems  Constitutional:  Positive for fatigue. Negative for activity change, appetite change, fever and unexpected weight change.  HENT:  Negative for congestion, rhinorrhea, sore throat and trouble swallowing.   Eyes:  Negative for pain, redness, itching and visual disturbance.  Respiratory:  Negative for cough, chest tightness, shortness of breath and wheezing.   Cardiovascular:  Negative for chest pain and palpitations.  Gastrointestinal:  Positive for diarrhea and nausea. Negative for abdominal distention, abdominal pain, anal bleeding, blood in  stool, constipation, rectal pain and vomiting.       Diarrhea is resolved today  Endocrine: Negative for cold intolerance, heat intolerance, polydipsia and polyuria.  Genitourinary:  Negative for difficulty urinating, dysuria, frequency and urgency.  Musculoskeletal:  Negative for arthralgias, joint swelling and myalgias.  Skin:  Negative for pallor and rash.  Neurological:  Negative for dizziness, tremors, weakness, numbness and headaches.  Hematological:  Negative for adenopathy. Does not bruise/bleed easily.  Psychiatric/Behavioral:  Negative for decreased concentration and dysphoric mood. The patient is not nervous/anxious.        Objective:   Physical Exam Constitutional:      General: He is not in acute distress.    Appearance: Normal appearance. He is well-developed. He is obese. He is not ill-appearing or  diaphoretic.  HENT:     Head: Normocephalic and atraumatic.     Mouth/Throat:     Mouth: Mucous membranes are moist.  Eyes:     General: No scleral icterus.    Conjunctiva/sclera: Conjunctivae normal.     Pupils: Pupils are equal, round, and reactive to light.  Neck:     Thyroid: No thyromegaly.     Vascular: No carotid bruit or JVD.  Cardiovascular:     Rate and Rhythm: Normal rate and regular rhythm.     Heart sounds: Normal heart sounds.     No gallop.  Pulmonary:     Effort: Pulmonary effort is normal. No respiratory distress.     Breath sounds: Normal breath sounds. No wheezing or rales.  Abdominal:     General: There is no distension or abdominal bruit.     Palpations: Abdomen is soft. There is no mass.     Tenderness: There is no abdominal tenderness. There is no right CVA tenderness, left CVA tenderness, guarding or rebound.  Musculoskeletal:     Cervical back: Normal range of motion and neck supple.     Right lower leg: No edema.     Left lower leg: No edema.  Lymphadenopathy:     Cervical: No cervical adenopathy.  Skin:    General: Skin is warm and dry.     Coloration: Skin is not jaundiced or pale.     Findings: No bruising or rash.  Neurological:     Mental Status: He is alert.     Coordination: Coordination normal.     Deep Tendon Reflexes: Reflexes are normal and symmetric. Reflexes normal.  Psychiatric:        Mood and Affect: Mood normal.           Assessment & Plan:   Problem List Items Addressed This Visit       Other   Nausea - Primary    Nausea w/o vomiting , some mild diarrhea s/p eating sushi  Also intermittent vertigo type dizziness/ not unusual for him  Pt suspects food poisoning or adverse reaction to food  Improved now  Still nauseated and weak feeling  Lab today  Reassuring exam   Discussed rehydration and slow advancement of diet as tolerated Phenergan for nausea/dizziness with caution of sedation Update if not starting to  improve in a week or if worsening  Call back and Er precautions noted in detail today   Meds ordered this encounter  Medications   promethazine (PHENERGAN) 25 MG tablet    Sig: Take 0.5-1 tablets (12.5-25 mg total) by mouth every 8 (eight) hours as needed for nausea or vomiting.    Dispense:  20 tablet  Refill:  0         Relevant Orders   CBC with Differential/Platelet (Completed)   Basic metabolic panel (Completed)   Hepatic function panel (Completed)   Prediabetes    Pt asked for A1c to be added to labs Has been working on diet   disc imp of low glycemic diet and wt loss to prevent DM2       Relevant Orders   Hemoglobin A1c (Completed)

## 2023-01-16 NOTE — Assessment & Plan Note (Signed)
Nausea w/o vomiting , some mild diarrhea s/p eating sushi  Also intermittent vertigo type dizziness/ not unusual for him  Pt suspects food poisoning or adverse reaction to food  Improved now  Still nauseated and weak feeling  Lab today  Reassuring exam   Discussed rehydration and slow advancement of diet as tolerated Phenergan for nausea/dizziness with caution of sedation Update if not starting to improve in a week or if worsening  Call back and Er precautions noted in detail today   Meds ordered this encounter  Medications   promethazine (PHENERGAN) 25 MG tablet    Sig: Take 0.5-1 tablets (12.5-25 mg total) by mouth every 8 (eight) hours as needed for nausea or vomiting.    Dispense:  20 tablet    Refill:  0

## 2023-01-16 NOTE — Assessment & Plan Note (Signed)
Pt asked for A1c to be added to labs Has been working on diet   disc imp of low glycemic diet and wt loss to prevent DM2

## 2023-01-21 ENCOUNTER — Encounter: Payer: Self-pay | Admitting: Internal Medicine

## 2023-01-21 ENCOUNTER — Encounter: Payer: No Typology Code available for payment source | Admitting: Physical Therapy

## 2023-01-21 ENCOUNTER — Ambulatory Visit (INDEPENDENT_AMBULATORY_CARE_PROVIDER_SITE_OTHER): Payer: No Typology Code available for payment source | Admitting: Internal Medicine

## 2023-01-21 VITALS — BP 106/70 | HR 73 | Temp 97.5°F | Ht 70.0 in | Wt 241.0 lb

## 2023-01-21 DIAGNOSIS — R42 Dizziness and giddiness: Secondary | ICD-10-CM

## 2023-01-21 MED ORDER — MECLIZINE HCL 25 MG PO TABS: 25.0000 mg | ORAL_TABLET | Freq: Three times a day (TID) | ORAL | 0 refills | Status: DC | PRN

## 2023-01-21 NOTE — Assessment & Plan Note (Signed)
Seems like classic vestibular vertigo syndrome One bad spell--now mild Will give Rx for meclizine 25 tid prn

## 2023-01-21 NOTE — Progress Notes (Signed)
Subjective:    Patient ID: Stephen Ramos., male    DOB: 04/22/1961, 62 y.o.   MRN: [id number]  HPI Here due to "vertigo"  Having some vertigo---first started with mild symptoms ~10 days ago Very mild but when he stands--he feels like he is going to sway one way or the other Did wake one week ago with true vertigo--room spinning, nausea and vomiting x 1 Went and got nausea medication--it helped (phenergan)  Has to be careful walking--but able to be steady if slow Some ringing and irritation in ears--has used new stethescope (it seemed to push in his ears more) No recent travel No change in hearing  Current Outpatient Medications on File Prior to Visit  Medication Sig Dispense Refill   cyclobenzaprine (FLEXERIL) 10 MG tablet Take 0.5-1 tablets (5-10 mg total) by mouth daily as needed for muscle spasms (sedation caution). 30 tablet 3   MULTIPLE VITAMIN PO Take 1 tablet by mouth every other day.      promethazine (PHENERGAN) 25 MG tablet Take 0.5-1 tablets (12.5-25 mg total) by mouth every 8 (eight) hours as needed for nausea or vomiting. 20 tablet 0   No current facility-administered medications on file prior to visit.    No Known Allergies  Past Medical History:  Diagnosis Date   Chicken pox    Heart murmur 2011   Hordeolum internum left lower eyelid 10/11/2016   Pain in left thigh 09/18/2021   Plantar wart of right foot 05/11/2020   Prediabetes    Right shoulder pain 06/24/2020   Vertigo     Past Surgical History:  Procedure Laterality Date   CHOLECYSTECTOMY  2012    Family History  Problem Relation Age of Onset   Diabetes Mother    Diabetes Father    Heart disease Father 46       Myocardial   Kidney disease Father    Stroke Father    Hypertension Father    Colon cancer Neg Hx     Social History   Socioeconomic History   Marital status: Single    Spouse name: Not on file   Number of children: Not on file   Years of education: Not on file   Highest  education level: Not on file  Occupational History   Not on file  Tobacco Use   Smoking status: Never   Smokeless tobacco: Never  Substance and Sexual Activity   Alcohol use: No    Alcohol/week: 0.0 standard drinks of alcohol   Drug use: No   Sexual activity: Not on file  Other Topics Concern   Not on file  Social History Narrative   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   Works as a Engineer, civil (consulting).   Highest level of education is bachelors.   Works for [employer].   Enjoys traveling, reading, collecting coins.   Social Determinants of Health   Financial Resource Strain: Not on file  Food Insecurity: Not on file  Transportation Needs: Not on file  Physical Activity: Not on file  Stress: Not on file  Social Connections: Not on file  Intimate Partner Violence: Not on file   Review of Systems No fever Has had past vertigo    Objective:   Physical Exam Constitutional:      Appearance: Normal appearance.  Cardiovascular:     Rate and Rhythm: Normal rate and regular rhythm.     Heart sounds: No murmur heard.    No gallop.  Pulmonary:     Effort: Pulmonary  effort is normal.     Breath sounds: Normal breath sounds. No wheezing or rales.  Musculoskeletal:     Cervical back: Neck supple.  Lymphadenopathy:     Cervical: No cervical adenopathy.  Neurological:     Mental Status: He is alert and oriented to person, place, and time.     Cranial Nerves: Cranial nerves 2-12 are intact.     Motor: No weakness, tremor or abnormal muscle tone.     Coordination: Romberg sign negative.     Gait: Gait normal.            Assessment & Plan:

## 2023-02-04 ENCOUNTER — Encounter: Payer: No Typology Code available for payment source | Admitting: Physical Therapy

## 2023-02-05 ENCOUNTER — Ambulatory Visit: Payer: No Typology Code available for payment source | Admitting: Primary Care

## 2023-02-11 ENCOUNTER — Encounter: Payer: No Typology Code available for payment source | Admitting: Physical Therapy

## 2023-02-12 ENCOUNTER — Ambulatory Visit: Payer: No Typology Code available for payment source | Admitting: Primary Care

## 2023-02-26 ENCOUNTER — Ambulatory Visit: Payer: No Typology Code available for payment source | Admitting: Primary Care

## 2023-03-05 ENCOUNTER — Ambulatory Visit: Payer: No Typology Code available for payment source | Admitting: Primary Care

## 2023-03-26 ENCOUNTER — Ambulatory Visit: Payer: No Typology Code available for payment source | Admitting: Primary Care

## 2023-04-09 ENCOUNTER — Ambulatory Visit: Payer: No Typology Code available for payment source | Admitting: Primary Care

## 2023-04-14 NOTE — Progress Notes (Unsigned)
    Stephen Tibbett T. Douglass Dunshee, MD, CAQ Sports Medicine [facility] [address] [place], [address]  Phone: [phone]  FAX: [phone]  Stephen Ramos. - 62 y.o. male  MRN [id number]  Date of Birth: 02-24-1961  Date: 04/15/2023  PCP: Doreene Nest, NP  Referral: Doreene Nest, NP  No chief complaint on file.  Subjective:   Stephen Cay. is a 62 y.o. very pleasant male patient with There is no height or weight on file to calculate BMI. who presents with the following:  Stephen Ramos is a very nice gentleman who I remember quite well from seeing him over the years.  He does have a history of some intermittent chronic back pain that I saw him for this year, and have also seen him before for some right-sided shoulder pain.  He is a Engineer, civil (consulting).  Presents today with some ongoing shoulder/possible muscle injury.    Review of Systems is noted in the HPI, as appropriate  Objective:   There were no vitals taken for this visit.  GEN: No acute distress; alert,appropriate. PULM: Breathing comfortably in no respiratory distress PSYCH: Normally interactive.   Laboratory and Imaging Data:  Assessment and Plan:   ***

## 2023-04-15 ENCOUNTER — Ambulatory Visit (INDEPENDENT_AMBULATORY_CARE_PROVIDER_SITE_OTHER): Payer: No Typology Code available for payment source | Admitting: Family Medicine

## 2023-04-15 ENCOUNTER — Encounter: Payer: Self-pay | Admitting: Family Medicine

## 2023-04-15 VITALS — BP 100/60 | HR 96 | Temp 97.3°F | Ht 70.0 in | Wt 238.0 lb

## 2023-04-15 DIAGNOSIS — S46811A Strain of other muscles, fascia and tendons at shoulder and upper arm level, right arm, initial encounter: Secondary | ICD-10-CM

## 2023-04-15 MED ORDER — CYCLOBENZAPRINE HCL 10 MG PO TABS: 5.0000 mg | ORAL_TABLET | Freq: Every day | ORAL | 3 refills | Status: DC | PRN

## 2023-04-17 ENCOUNTER — Ambulatory Visit: Payer: No Typology Code available for payment source | Admitting: Primary Care

## 2023-04-30 ENCOUNTER — Other Ambulatory Visit: Payer: Self-pay | Admitting: Family Medicine

## 2023-04-30 ENCOUNTER — Telehealth: Payer: Self-pay | Admitting: Primary Care

## 2023-04-30 DIAGNOSIS — S46811A Strain of other muscles, fascia and tendons at shoulder and upper arm level, right arm, initial encounter: Secondary | ICD-10-CM

## 2023-04-30 DIAGNOSIS — M25511 Pain in right shoulder: Secondary | ICD-10-CM

## 2023-04-30 NOTE — Telephone Encounter (Signed)
Mr. Odor notified as instructed by telephone.

## 2023-04-30 NOTE — Telephone Encounter (Signed)
Can you let Stephen Ramos know that I put in that PT order for him.  [facility] should contact him directly about his appointment.

## 2023-04-30 NOTE — Telephone Encounter (Signed)
   Reason for Referral Request: R shoulder pain   Has patient been seen PCP for this complaint? Saw Dr. Patsy Lager on 04/15/2023  No,  please schedule patient for appointment for complaint.  Patient scheduled on:   Yes, please find out following information.  Referral for which specialty: Physical Therapy   Preferred office/provider: [facility]

## 2023-05-13 ENCOUNTER — Encounter: Payer: Self-pay | Admitting: Physical Therapy

## 2023-05-13 ENCOUNTER — Ambulatory Visit (INDEPENDENT_AMBULATORY_CARE_PROVIDER_SITE_OTHER): Payer: No Typology Code available for payment source | Admitting: Physical Therapy

## 2023-05-13 DIAGNOSIS — M25511 Pain in right shoulder: Secondary | ICD-10-CM | POA: Diagnosis not present

## 2023-05-13 DIAGNOSIS — M6281 Muscle weakness (generalized): Secondary | ICD-10-CM

## 2023-05-13 DIAGNOSIS — R293 Abnormal posture: Secondary | ICD-10-CM | POA: Diagnosis not present

## 2023-05-13 NOTE — Therapy (Signed)
OUTPATIENT PHYSICAL THERAPY SHOULDER EVALUATION   Patient Name: Bjorn Tetlow. MRN: [id number] DOB:[birth year], 62 y.o., male Today's Date: 05/13/2023  END OF SESSION:  PT End of Session - 05/13/23 1513     Visit Number 1    Number of Visits 8    Date for PT Re-Evaluation 07/08/23    PT Start Time 1510    PT Stop Time 1550    PT Time Calculation (min) 40 min    Activity Tolerance Patient tolerated treatment well    Behavior During Therapy [facility] for tasks assessed/performed             Past Medical History:  Diagnosis Date   Chicken pox    Heart murmur 2011   Hordeolum internum left lower eyelid 10/11/2016   Pain in left thigh 09/18/2021   Plantar wart of right foot 05/11/2020   Prediabetes    Right shoulder pain 06/24/2020   Vertigo    Past Surgical History:  Procedure Laterality Date   CHOLECYSTECTOMY  2012   Patient Active Problem List   Diagnosis Date Noted   Vertigo 01/21/2023   Nausea 01/16/2023   Headache 08/01/2022   Hyperlipidemia 03/22/2016   Prediabetes 03/22/2016   Preventative health care 12/27/2014   Obesity (BMI 30.0-34.9) 12/15/2014    PCP: Doreene Nest, NP   REFERRING PROVIDER: Hannah Beat, MD   REFERRING DIAG:  M25.511 (ICD-10-CM) - Acute pain of right shoulder  S46.811A (ICD-10-CM) - Strain of right trapezius muscle, initial encounter    THERAPY DIAG:  Acute pain of right shoulder  Muscle weakness (generalized)  Abnormal posture  Rationale for Evaluation and Treatment: Rehabilitation  ONSET DATE:   SUBJECTIVE:  SUBJECTIVE STATEMENT: Pt arriving today  reporting 4/10 pain in his Rt shoulder. Pt reporting down his mid scapular area. Pt stating pushing gloves that were too small on 3 days in a row aggravated his shoulder.   PERTINENT HISTORY: Heart murmur, pre DM, vertigo    PAIN:  NPRS scale: 4/10 Pain location: Rt shoulder  Pain description: dull pain Aggravating factors: pushing arm forward Relieving factors: muscle relaxor  PRECAUTIONS: None  WEIGHT BEARING RESTRICTIONS: No  FALLS:  Has patient fallen in last 6 months? 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: Independent  PATIENT GOALS:be able to work out without pain  Next MD visit:   OBJECTIVE:   DIAGNOSTIC FINDINGS:  No images of pt's shoulder  PATIENT SURVEYS:  05/13/23:  FOTO intake:  62%    COGNITION: Overall cognitive status: WFL     SENSATION: WFL  POSTURE: Rounded shoulders and forward head  UPPER EXTREMITY ROM:   ROM Right 05/13/23 Left 05/13/23  Shoulder flexion 138 150  Shoulder extension 152 164  Shoulder abduction    Shoulder adduction    Shoulder internal rotation 70 70  Shoulder external rotation 60 75  Elbow flexion    Elbow extension    Wrist flexion    Wrist extension    Wrist ulnar deviation    Wrist radial deviation    Wrist pronation    Wrist supination    (Blank rows = not tested)  UPPER EXTREMITY MMT:  MMT Right 05/13/23 Left 05/13/23  Shoulder flexion 4+ 5  Shoulder extension 5 5  Shoulder abduction 4+ 5  Shoulder adduction    Shoulder internal rotation 5 5  Shoulder external rotation 5 5  Middle trapezius    Lower trapezius    Elbow flexion    Elbow extension    Wrist flexion    Wrist extension    Wrist ulnar deviation    Wrist radial deviation    Wrist pronation    Wrist supination    Grip strength (lbs)    (Blank rows = not  tested)  SHOULDER SPECIAL TESTS: Negative impingement test   JOINT MOBILITY TESTING:  Tightness in pt's Rt upper trap and Rhomboids  PALPATION:  TTP Rt upper trap, Rhomboids, and levator scapulae  TODAY'S TREATMENT:                                                                                                       DATE: 05/13/23 Therex:    HEP instruction/performance c cues for techniques, handout provided.  Trial set performed of each for comprehension and symptom assessment.  See below for exercise list   PATIENT EDUCATION: Education details: HEP, POC, DN handout issued Person educated: Patient Education method: Explanation, Demonstration, Verbal cues, and Handouts Education comprehension: verbalized understanding, returned demonstration, and verbal cues required  HOME EXERCISE PROGRAM: Access Code: [email or web address] URL: [email or web address] Date: 05/13/2023 Prepared by: Narda Amber  Exercises - Seated Upper Trap Stretch  - 2 x daily - 7 x weekly - 3-5 reps - 10 seconds hold - Standing Lower Cervical and Upper Thoracic Stretch  - 2 x daily - 7 x weekly - 3-5 reps - 5 seconds hold - Gentle Levator Scapulae Stretch  - 2 x daily - 7 x weekly - 3-5 reps - 5 seconds hold - Standing Shoulder Row with Anchored Resistance  - 2 x daily - 7 x weekly - 2 sets - 10 reps - 3 seconds hold - Supine Scapular Protraction in Flexion with Dumbbells  - 2 x daily - 7 x weekly - 2 sets - 10 reps - Supine Chin Tuck  - 2 x daily - 7 x weekly - 10 reps - 5 seconds hold - Doorway Rhomboid Stretch  - 2 x daily - 7 x weekly - 3-5 reps - 10 seconds hold  ASSESSMENT:  CLINICAL IMPRESSION: Patient is a 62 y.o. who comes to clinic with complaints of Rt shoulder pain in upper trap, levator and  rhomboids. Pt presents with mobility, strength and movement coordination deficits that impair their ability to perform usual daily and recreational functional activities without increase difficulty/symptoms at this time.  Patient to benefit from skilled PT services to address impairments and limitations to improve to previous level of function without restriction secondary to condition.   OBJECTIVE IMPAIRMENTS: decreased mobility, decreased ROM, decreased strength, increased edema, impaired flexibility, and impaired UE functional use.   ACTIVITY LIMITATIONS: lifting, sleeping, bathing, toileting, dressing, reach over head, and hygiene/grooming  PARTICIPATION LIMITATIONS: cleaning, laundry, community activity, occupation, and yard work  PERSONAL FACTORS:  see pertinent history above  are also affecting patient's functional outcome.   REHAB POTENTIAL: Good  CLINICAL DECISION MAKING: Stable/uncomplicated  EVALUATION COMPLEXITY: Low   GOALS: Goals reviewed with patient? Yes  SHORT TERM GOALS: (target date for Short term goals are 3 weeks 06/03/2023)  1.Patient will demonstrate independent use of home exercise program to maintain progress from in clinic treatments. Goal status: New  LONG TERM GOALS: (target dates for all long term goals are 10 weeks  07/22/2023)   1. Patient will demonstrate/report pain at worst less than or equal to 2/10 to facilitate minimal limitation in daily activity secondary to pain symptoms. Goal status: New   2. Patient will demonstrate independent use of home exercise program to facilitate ability to maintain/progress functional gains from skilled physical  therapy services. Goal status: New   3. Patient will demonstrate FOTO outcome > or = 75 % to indicate reduced disability due to condition. Goal status: New   4.  Patient will demonstrate Rt UE MMT 5/5 throughout to facilitate lifting, reaching, carrying at [facility] in daily activity.   Goal status: New   5.   Patient will demonstrate Rt GH joint AROM WFL s symptoms to facilitate usual overhead reaching, self care, dressing at PLOF.    Goal status: New   6.  pt will be able to improve his Rt shoulder flexion to >/= 150 degrees in order to improve functional mobility.  Goal status: New   7.  Pt will be able to lift 10# from counter height to overhead shelf with pain </= 2/10 in her Rt UE.  Goal Status: New  PLAN:  PT FREQUENCY: 1x/week  PT DURATION: 8 weeks (07/08/2023)    PLANNED INTERVENTIONS: Can include 24401- PT Re-evaluation, 97110-Therapeutic exercises, 97530- Therapeutic activity, 97112- Neuromuscular re-education, 97535- Self Care, 97140- Manual therapy, [id number]- Gait training, [phone]- Orthotic Fit/training, [phone]- Canalith repositioning, [id number]- Aquatic Therapy, 97014- Electrical stimulation (unattended), [id number]- Electrical stimulation (manual), [id number]- Vasopneumatic device, [id number]- Ultrasound, [id number]- Traction (mechanical), [id number]- Ionotophoresis 4mg /ml Dexamethasone, Patient/Family education, Balance training, Stair training, Taping, Dry Needling, Joint mobilization, Joint manipulation, Spinal manipulation, Spinal mobilization, Scar mobilization, Vestibular training, Visual/preceptual remediation/compensation, DME instructions, Cryotherapy, and Moist heat.  All performed as medically necessary.  All included unless contraindicated  PLAN FOR NEXT SESSION: Review HEP knowledge/results, manual  therapy, scapular mobs, consider DN upper trap and levator,       Sharmon Leyden, PT, MPT 05/13/2023, 4:09 PM

## 2023-05-27 ENCOUNTER — Encounter: Payer: Self-pay | Admitting: Rehabilitative and Restorative Service Providers"

## 2023-05-27 ENCOUNTER — Ambulatory Visit (INDEPENDENT_AMBULATORY_CARE_PROVIDER_SITE_OTHER): Payer: No Typology Code available for payment source | Admitting: Rehabilitative and Restorative Service Providers"

## 2023-05-27 DIAGNOSIS — R293 Abnormal posture: Secondary | ICD-10-CM | POA: Diagnosis not present

## 2023-05-27 DIAGNOSIS — M25511 Pain in right shoulder: Secondary | ICD-10-CM | POA: Diagnosis not present

## 2023-05-27 DIAGNOSIS — M6281 Muscle weakness (generalized): Secondary | ICD-10-CM | POA: Diagnosis not present

## 2023-05-27 NOTE — Therapy (Signed)
OUTPATIENT PHYSICAL THERAPY TREATMENT   Patient Name: Stephen Ramos. MRN: [id number] DOB:08-10-1960, 62 y.o., male Today's Date: 05/27/2023  END OF SESSION:  PT End of Session - 05/27/23 1455     Visit Number 2    Number of Visits 8    Date for PT Re-Evaluation 07/08/23    PT Start Time 1455    PT Stop Time 1535    PT Time Calculation (min) 40 min    Activity Tolerance Patient tolerated treatment well    Behavior During Therapy [facility] for tasks assessed/performed              Past Medical History:  Diagnosis Date   Chicken pox    Heart murmur 2011   Hordeolum internum left lower eyelid 10/11/2016   Pain in left thigh 09/18/2021   Plantar wart of right foot 05/11/2020   Prediabetes    Right shoulder pain 06/24/2020   Vertigo    Past Surgical History:  Procedure Laterality Date   CHOLECYSTECTOMY  2012   Patient Active Problem List   Diagnosis Date Noted   Vertigo 01/21/2023   Nausea 01/16/2023   Headache 08/01/2022   Hyperlipidemia 03/22/2016   Prediabetes 03/22/2016   Preventative health care 12/27/2014   Obesity (BMI 30.0-34.9) 12/15/2014    PCP: Doreene Nest, NP   REFERRING PROVIDER: Hannah Beat, MD   REFERRING DIAG:  M25.511 (ICD-10-CM) - Acute pain of right shoulder  S46.811A (ICD-10-CM) - Strain of right trapezius muscle, initial encounter    THERAPY DIAG:  Acute pain of right shoulder  Muscle weakness (generalized)  Abnormal posture  Rationale for Evaluation and Treatment: Rehabilitation  ONSET DATE:   SUBJECTIVE:  SUBJECTIVE STATEMENT: Pt indicated feeling better since  first visit.  Reported feeling the mild trouble at end of the day.  Reported the tennis ball pressure has been helping.   PERTINENT HISTORY: Heart murmur, pre DM, vertigo   PAIN:  NPRS scale: 1-2/10 at worst in last few days.  Pain location: Rt shoulder  Pain description: dull pain Aggravating factors: pushing arm forward Relieving factors: muscle relaxor  PRECAUTIONS: None  WEIGHT BEARING RESTRICTIONS: No  FALLS:  Has patient fallen in last 6 months? 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: Independent  PATIENT GOALS:be able to work out without pain  OBJECTIVE:   DIAGNOSTIC FINDINGS:  05/13/2023 review:  No images of pt's shoulder  PATIENT SURVEYS:  05/13/23:  FOTO intake:  62%   COGNITION: 05/13/2023 Overall cognitive status: WFL     SENSATION: 05/13/2023 WFL  POSTURE: 05/13/2023 Rounded shoulders and forward head  UPPER EXTREMITY ROM:   ROM Right 05/13/23 Left 05/13/23  Shoulder flexion 138 150  Shoulder extension 152 164  Shoulder abduction    Shoulder adduction    Shoulder internal rotation 70 70  Shoulder external rotation 60 75  Elbow flexion    Elbow extension    Wrist flexion    Wrist extension    Wrist ulnar deviation    Wrist radial deviation    Wrist pronation    Wrist supination    (Blank rows = not tested)  UPPER EXTREMITY MMT:  MMT Right 05/13/23 Left 05/13/23 Right 05/27/2023  Shoulder flexion 4+ 5 5/5  Shoulder extension 5 5 5/5  Shoulder abduction 4+ 5 5/5  Shoulder adduction     Shoulder internal rotation 5 5   Shoulder external rotation 5 5   Middle trapezius     Lower trapezius     Elbow flexion     Elbow extension     Wrist flexion     Wrist extension     Wrist ulnar deviation     Wrist radial deviation     Wrist pronation     Wrist supination      Grip strength (lbs)     (Blank rows = not tested)  SHOULDER SPECIAL TESTS: 05/13/2023 Negative impingement test   JOINT MOBILITY TESTING:  05/13/2023 Tightness in pt's Rt upper trap and Rhomboids  PALPATION:  05/13/2023 TTP Rt upper trap, Rhomboids, and levator scapulae  TODAY'S TREATMENT:                                                                                                       DATE:  05/27/2023 Manual Percussive device to Rt thoracic paraspinals, Rt upper trap, rhomboids, infraspinatus.  Performed same as trial to Lt.   Therex: UBE fwd/back 3 mins each way lvl 3.0 with 30 sec rest between directions Lat pull down blue band 2 x 15 seated Blue band ER c towel under arm 2 x 10 bilateral with cues for home use.  Additional time spent in review of existing HEP and advice on slow return to home exercise routine including dips/ pull ups.  Discussed leg assisted concentric movement to help ease difficulty when he does return to activity.      TODAY'S TREATMENT:                                                                                                       DATE:05/13/23 Therex:    HEP instruction/performance c cues for techniques, handout provided.  Trial set performed of each for comprehension and symptom assessment.  See below for exercise list   PATIENT EDUCATION: Education details: HEP, POC, DN handout issued Person educated: Patient Education method: Explanation, Demonstration, Verbal cues, and Handouts Education comprehension: verbalized understanding, returned demonstration, and verbal cues required  HOME EXERCISE PROGRAM: Access Code: [email or web address] URL: [email or web address] Date: 05/13/2023 Prepared by: Narda Amber  Exercises - Seated Upper  Trap Stretch  - 2 x daily - 7 x weekly - 3-5 reps - 10 seconds hold - Standing Lower Cervical and Upper Thoracic Stretch  - 2 x daily - 7 x weekly - 3-5 reps - 5 seconds hold - Gentle Levator Scapulae Stretch  - 2 x daily - 7 x weekly - 3-5 reps - 5 seconds hold - Standing Shoulder Row with Anchored Resistance  - 2 x daily - 7 x weekly - 2 sets - 10 reps - 3 seconds hold - Supine Scapular Protraction in Flexion with Dumbbells  - 2 x daily - 7 x weekly - 2 sets - 10 reps - Supine Chin Tuck  - 2 x daily - 7 x weekly - 10 reps - 5 seconds hold - Doorway Rhomboid Stretch  - 2 x daily - 7 x weekly - 3-5 reps - 10 seconds hold  ASSESSMENT:  CLINICAL IMPRESSION: Pt reported improvement in symptoms and desired to avoid dry needling this visit due to improvements already noted.  May ask for future use pending symptoms.  Strength testing for Rt shoulder showed improvement compared to previous.  Demo use of percussive device, Pt expressed  desire to possibly invest in his own.  Pt may continue to benefit from skilled PT services to progress towards PLOF.   OBJECTIVE IMPAIRMENTS: decreased mobility, decreased ROM, decreased strength, increased edema, impaired flexibility, and impaired UE functional use.   ACTIVITY LIMITATIONS: lifting, sleeping, bathing, toileting, dressing, reach over head, and hygiene/grooming  PARTICIPATION LIMITATIONS: cleaning, laundry, community activity, occupation, and yard work  PERSONAL FACTORS:  see pertinent history above  are also affecting patient's functional outcome.   REHAB POTENTIAL: Good  CLINICAL DECISION MAKING: Stable/uncomplicated  EVALUATION COMPLEXITY: Low   GOALS: Goals reviewed with patient? Yes  SHORT TERM GOALS: (target date for Short term goals are 3 weeks 06/03/2023)  1.Patient will demonstrate independent use of home exercise program to maintain progress from in clinic treatments. Goal status: on going 05/27/2023  LONG TERM GOALS: (target dates  for all long term goals are 10 weeks  07/22/2023)   1. Patient will demonstrate/report pain at worst less than or equal to 2/10 to facilitate minimal limitation in daily activity secondary to pain symptoms. Goal status: New   2. Patient will demonstrate independent use of home exercise program to facilitate ability to maintain/progress functional gains from skilled physical therapy services. Goal status: New   3. Patient will demonstrate FOTO outcome > or = 75 % to indicate reduced disability due to condition. Goal status: New   4.  Patient will demonstrate Rt UE MMT 5/5 throughout to facilitate lifting, reaching, carrying at [facility] in daily activity.   Goal status: New   5.  Patient will demonstrate Rt GH joint AROM WFL s symptoms to facilitate usual overhead reaching, self care, dressing at PLOF.    Goal status: New   6.  pt will be able to improve his Rt shoulder flexion to >/= 150 degrees in order to improve functional mobility.  Goal status: New   7.  Pt will be able to lift 10# from counter height to overhead shelf with pain </= 2/10 in her Rt UE.  Goal Status: New  PLAN:  PT FREQUENCY: 1x/week  PT DURATION: 8 weeks (07/08/2023)    PLANNED INTERVENTIONS: Can include 47829- PT Re-evaluation, 97110-Therapeutic exercises, 97530- Therapeutic activity, 97112- Neuromuscular re-education, 97535- Self Care, 97140- Manual therapy, [id number]- Gait training, [id number]- Orthotic Fit/training, [phone]- Canalith repositioning, [id number]- Aquatic Therapy, 97014- Electrical stimulation (unattended), [id number]- Electrical stimulation (manual), [id number]- Vasopneumatic device, [id number]- Ultrasound, [id number]- Traction (mechanical), [id number]- Ionotophoresis 4mg /ml Dexamethasone, Patient/Family education, Balance training, Stair training, Taping, Dry Needling, Joint mobilization, Joint manipulation, Spinal manipulation, Spinal mobilization, Scar mobilization, Vestibular training, Visual/preceptual remediation/compensation, DME  instructions, Cryotherapy, and Moist heat.  All performed as medically necessary.  All included unless contraindicated  PLAN FOR NEXT SESSION:  Recheck of symptoms and discussion about manual options pending presentation.   Chyrel Masson, PT, DPT, OCS, ATC 05/27/23  3:39 PM

## 2023-06-03 ENCOUNTER — Encounter: Payer: Self-pay | Admitting: Physical Therapy

## 2023-06-03 ENCOUNTER — Ambulatory Visit (INDEPENDENT_AMBULATORY_CARE_PROVIDER_SITE_OTHER): Payer: No Typology Code available for payment source | Admitting: Physical Therapy

## 2023-06-03 DIAGNOSIS — M25511 Pain in right shoulder: Secondary | ICD-10-CM

## 2023-06-03 DIAGNOSIS — M6281 Muscle weakness (generalized): Secondary | ICD-10-CM

## 2023-06-03 DIAGNOSIS — R293 Abnormal posture: Secondary | ICD-10-CM

## 2023-06-03 NOTE — Therapy (Signed)
 OUTPATIENT PHYSICAL THERAPY TREATMENT   Patient Name: Stephen Ramos. MRN: [id number] DOB:08/20/1960, 63 y.o., male Today's Date: 06/03/2023  END OF SESSION:  PT End of Session - 06/03/23 1424     Visit Number 3    Number of Visits 8    Date for PT Re-Evaluation 07/08/23    PT Start Time 1345    PT Stop Time 1430    PT Time Calculation (min) 45 min    Activity Tolerance Patient tolerated treatment well    Behavior During Therapy [facility] for tasks assessed/performed               Past Medical History:  Diagnosis Date   Chicken pox    Heart murmur 2011   Hordeolum internum left lower eyelid 10/11/2016   Pain in left thigh 09/18/2021   Plantar wart of right foot 05/11/2020   Prediabetes    Right shoulder pain 06/24/2020   Vertigo    Past Surgical History:  Procedure Laterality Date   CHOLECYSTECTOMY  2012   Patient Active Problem List   Diagnosis Date Noted   Vertigo 01/21/2023   Nausea 01/16/2023   Headache 08/01/2022   Hyperlipidemia 03/22/2016   Prediabetes 03/22/2016   Preventative health care 12/27/2014   Obesity (BMI 30.0-34.9) 12/15/2014    PCP: Gretta Comer POUR, NP   REFERRING PROVIDER: Watt Mirza, MD   REFERRING DIAG:  M25.511 (ICD-10-CM) - Acute pain of right shoulder  S46.811A (ICD-10-CM) - Strain of right trapezius muscle, initial encounter    THERAPY DIAG:  Acute pain of right shoulder  Muscle weakness (generalized)  Abnormal posture  Rationale for Evaluation and Treatment: Rehabilitation  ONSET DATE:   SUBJECTIVE:  SUBJECTIVE STATEMENT: Pt indicated he thought the  percussive device felt good in the moment but was then in overall more pain the next 3 days so does not want to have this again. Pain about 4/10 today  PERTINENT HISTORY: Heart murmur, pre DM, vertigo   PAIN:  NPRS scale: 4/10 today overall Pain location: Rt shoulder  Pain description: dull pain Aggravating factors: pushing arm forward Relieving factors: muscle relaxor  PRECAUTIONS: None  WEIGHT BEARING RESTRICTIONS: No  FALLS:  Has patient fallen in last 6 months? 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: Independent  PATIENT GOALS:be able to work out without pain  OBJECTIVE:   DIAGNOSTIC FINDINGS:  05/13/2023 review:  No images of pt's shoulder  PATIENT SURVEYS:  05/13/23:  FOTO intake:  62%   COGNITION: 05/13/2023 Overall cognitive status: WFL     SENSATION: 05/13/2023 WFL  POSTURE: 05/13/2023 Rounded shoulders and forward head  UPPER EXTREMITY ROM:   ROM Right 05/13/23 Left 05/13/23  Shoulder flexion 138 150  Shoulder extension 152 164  Shoulder abduction    Shoulder adduction    Shoulder internal rotation 70 70  Shoulder external rotation 60 75  Elbow flexion    Elbow extension    Wrist flexion    Wrist extension    Wrist ulnar deviation    Wrist radial deviation    Wrist pronation    Wrist supination    (Blank rows = not tested)  UPPER EXTREMITY MMT:  MMT Right 05/13/23 Left 05/13/23 Right 05/27/2023  Shoulder flexion 4+ 5 5/5  Shoulder extension 5 5 5/5  Shoulder abduction 4+ 5 5/5  Shoulder adduction     Shoulder internal rotation 5 5   Shoulder external rotation 5 5   Middle trapezius     Lower trapezius     Elbow flexion     Elbow extension     Wrist flexion     Wrist extension     Wrist ulnar deviation     Wrist radial deviation     Wrist pronation      Wrist supination     Grip strength (lbs)     (Blank rows = not tested)  SHOULDER SPECIAL TESTS: 05/13/2023 Negative impingement test   JOINT MOBILITY TESTING:  05/13/2023 Tightness in pt's Rt upper trap and Rhomboids  PALPATION:  05/13/2023 TTP Rt upper trap, Rhomboids, and levator scapulae  TODAY'S TREATMENT:                                                                                                        DATE:  06/02/2022 Manual  Rt periscapular Soft tissue mobilization and IASTM with graston tool gently to tolerance. Therex: UBE fwd/back 3 mins each way lvl 3.0 with 30 sec rest between directions Rows blue 2X15 Shoulder extensions blue 2X15 Blue band bilat  ER at same time 2 x 10 bilateral with cues for home use.  Blue band horizontal abduction 2X10 HEP additions reviewed and printed  Moist heat X 7  min not included in treatment time   DATE:  05/27/2023 Manual Percussive device to Rt thoracic paraspinals, Rt upper trap, rhomboids, infraspinatus.  Performed same as trial to Lt.   Therex: UBE fwd/back 3 mins each way lvl 3.0 with 30 sec rest between directions Lat pull down blue band 2 x 15 seated Blue band ER c towel under arm 2 x 10 bilateral with cues for home use.  Additional time spent in review of existing HEP and advice on slow return to home exercise routine including dips/ pull ups.  Discussed leg assisted concentric movement to help ease difficulty when he does return to activity.      TODAY'S TREATMENT:                                                                                                       DATE:05/13/23 Therex:    HEP instruction/performance c cues for techniques, handout provided.  Trial set performed of each for comprehension and symptom  assessment.  See below for exercise list   PATIENT EDUCATION: Education details: HEP, POC, DN handout issued Person educated: Patient Education method: Explanation, Demonstration, Verbal cues, and Handouts Education comprehension: verbalized understanding, returned demonstration, and verbal cues required  HOME EXERCISE PROGRAM: Access Code: [email or web address] URL: [email or web address] Date: 06/03/2023 Prepared by: Redell Moose  Exercises - Seated Upper Trap Stretch  - 2 x daily - 7 x weekly - 3-5 reps - 10 seconds hold - Standing Lower Cervical and Upper Thoracic Stretch  - 2 x daily - 7 x weekly - 3-5 reps - 5 seconds hold - Gentle Levator Scapulae Stretch  - 2 x daily - 7 x weekly - 3-5 reps - 5 seconds hold - Standing Shoulder Row with Anchored Resistance  - 2 x daily - 7 x weekly - 2 sets - 10 reps - 3 seconds hold - Supine Scapular Protraction in Flexion with Dumbbells  - 2 x daily - 7 x weekly - 2 sets - 10 reps - Supine Chin Tuck  - 2  x daily - 7 x weekly - 10 reps - 5 seconds hold - Doorway Rhomboid Stretch  - 2 x daily - 7 x weekly - 3-5 reps - 10 seconds hold - Shoulder External Rotation with Anchored Resistance (Mirrored)  - 2 x daily - 7 x weekly - 3 sets - 10 reps - Shoulder extension with resistance - Neutral  - 1 x daily - 6 x weekly - 2-3 sets - 10 reps - Shoulder External Rotation and Scapular Retraction with Resistance  - 1 x daily - 6 x weekly - 2-3 sets - 10 reps - Shoulder External Rotation and Scapular Retraction with Resistance  - 1 x daily - 6 x weekly - 2-3 sets - 10 reps - Standing Shoulder Horizontal Abduction with Resistance  - 1 x daily - 6 x weekly - 2-3 sets - 10 reps  ASSESSMENT:  CLINICAL IMPRESSION: He did not want to have the percussive device treatment so performed Soft tissue work with PT hands as well as education officer, environmental instead. He is open to try DN in future but did not want to try this today. He does appear to be doing his HEP and I did give  him some additional exercises today at his request.   OBJECTIVE IMPAIRMENTS: decreased mobility, decreased ROM, decreased strength, increased edema, impaired flexibility, and impaired UE functional use.   ACTIVITY LIMITATIONS: lifting, sleeping, bathing, toileting, dressing, reach over head, and hygiene/grooming  PARTICIPATION LIMITATIONS: cleaning, laundry, community activity, occupation, and yard work  PERSONAL FACTORS:  see pertinent history above  are also affecting patient's functional outcome.   REHAB POTENTIAL: Good  CLINICAL DECISION MAKING: Stable/uncomplicated  EVALUATION COMPLEXITY: Low   GOALS: Goals reviewed with patient? Yes  SHORT TERM GOALS: (target date for Short term goals are 3 weeks 06/03/2023)  1.Patient will demonstrate independent use of home exercise program to maintain progress from in clinic treatments. Goal status: on going 05/27/2023  LONG TERM GOALS: (target dates for all long term goals are 10 weeks  07/22/2023)   1. Patient will demonstrate/report pain at worst less than or equal to 2/10 to facilitate minimal limitation in daily activity secondary to pain symptoms. Goal status: New   2. Patient will demonstrate independent use of home exercise program to facilitate ability to maintain/progress functional gains from skilled physical therapy services. Goal status: New   3. Patient will demonstrate FOTO outcome > or = 75 % to indicate reduced disability due to condition. Goal status: New   4.  Patient will demonstrate Rt UE MMT 5/5 throughout to facilitate lifting, reaching, carrying at [facility] in daily activity.   Goal status: New   5.  Patient will demonstrate Rt GH joint AROM WFL s symptoms to facilitate usual overhead reaching, self care, dressing at PLOF.    Goal status: New   6.  pt will be able to improve his Rt shoulder flexion to >/= 150 degrees in order to improve functional mobility.  Goal status: New   7.  Pt will be able to lift 10#  from counter height to overhead shelf with pain </= 2/10 in her Rt UE.  Goal Status: New  PLAN:  PT FREQUENCY: 1x/week  PT DURATION: 8 weeks (07/08/2023)    PLANNED INTERVENTIONS: Can include 02853- PT Re-evaluation, 97110-Therapeutic exercises, 97530- Therapeutic activity, 97112- Neuromuscular re-education, 97535- Self Care, 97140- Manual therapy, [id number]- Gait training, [phone]- Orthotic Fit/training, [phone]- Canalith repositioning, [id number]- Aquatic Therapy, 97014- Electrical stimulation (unattended), [id number]- Electrical stimulation (manual), [id number]- Vasopneumatic device,  02964- Ultrasound, 02987- Traction (mechanical), [id number]- Ionotophoresis 4mg /ml Dexamethasone, Patient/Family education, Balance training, Stair training, Taping, Dry Needling, Joint mobilization, Joint manipulation, Spinal manipulation, Spinal mobilization, Scar mobilization, Vestibular training, Visual/preceptual remediation/compensation, DME instructions, Cryotherapy, and Moist heat.  All performed as medically necessary.  All included unless contraindicated  PLAN FOR NEXT SESSION: how did he do after manual therapy last time. Consider DN  Redell Moose, PT, DPT 06/03/23 2:24 PM

## 2023-06-10 ENCOUNTER — Encounter: Payer: Self-pay | Admitting: Physical Therapy

## 2023-06-10 ENCOUNTER — Ambulatory Visit (INDEPENDENT_AMBULATORY_CARE_PROVIDER_SITE_OTHER): Payer: No Typology Code available for payment source | Admitting: Physical Therapy

## 2023-06-10 DIAGNOSIS — M6281 Muscle weakness (generalized): Secondary | ICD-10-CM | POA: Diagnosis not present

## 2023-06-10 DIAGNOSIS — M5459 Other low back pain: Secondary | ICD-10-CM

## 2023-06-10 DIAGNOSIS — M25511 Pain in right shoulder: Secondary | ICD-10-CM | POA: Diagnosis not present

## 2023-06-10 DIAGNOSIS — R293 Abnormal posture: Secondary | ICD-10-CM

## 2023-06-10 DIAGNOSIS — R29898 Other symptoms and signs involving the musculoskeletal system: Secondary | ICD-10-CM

## 2023-06-10 NOTE — Therapy (Signed)
 OUTPATIENT PHYSICAL THERAPY TREATMENT   Patient Name: Stephen Ramos. MRN: [id number] DOB:06/15/1960, 63 y.o., male Today's Date: 06/10/2023  END OF SESSION:  PT End of Session - 06/10/23 1518     Visit Number 4    Number of Visits 8    Date for PT Re-Evaluation 07/08/23    PT Start Time 1430    PT Stop Time 1510    PT Time Calculation (min) 40 min    Activity Tolerance Patient tolerated treatment well    Behavior During Therapy [facility] for tasks assessed/performed                Past Medical History:  Diagnosis Date   Chicken pox    Heart murmur 2011   Hordeolum internum left lower eyelid 10/11/2016   Pain in left thigh 09/18/2021   Plantar wart of right foot 05/11/2020   Prediabetes    Right shoulder pain 06/24/2020   Vertigo    Past Surgical History:  Procedure Laterality Date   CHOLECYSTECTOMY  2012   Patient Active Problem List   Diagnosis Date Noted   Vertigo 01/21/2023   Nausea 01/16/2023   Headache 08/01/2022   Hyperlipidemia 03/22/2016   Prediabetes 03/22/2016   Preventative health care 12/27/2014   Obesity (BMI 30.0-34.9) 12/15/2014    PCP: Gretta Comer POUR, NP   REFERRING PROVIDER: Watt Mirza, MD   REFERRING DIAG:  M25.511 (ICD-10-CM) - Acute pain of right shoulder  S46.811A (ICD-10-CM) - Strain of right trapezius muscle, initial encounter    THERAPY DIAG:  Acute pain of right shoulder  Muscle weakness (generalized)  Abnormal posture  Other low back pain  Other symptoms and signs involving the musculoskeletal system  Rationale for Evaluation and Treatment: Rehabilitation  ONSET DATE:   SUBJECTIVE:  SUBJECTIVE STATEMENT: Pt arriving today 3-4/10 in Rt shoulder. Pt stating the percussion device made his pain worse.   PERTINENT HISTORY: Heart murmur, pre DM, vertigo   PAIN:  NPRS scale: 3-4/10 today overall Pain location: Rt shoulder  Pain description: dull pain Aggravating factors: pushing arm forward Relieving factors: muscle relaxor  PRECAUTIONS: None  WEIGHT BEARING RESTRICTIONS: No  FALLS:  Has patient fallen in last 6 months? 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: Independent  PATIENT GOALS:be able to work out without pain  OBJECTIVE:   DIAGNOSTIC FINDINGS:  05/13/2023 review:  No images of pt's shoulder  PATIENT SURVEYS:  05/13/23:  FOTO intake:  62%   COGNITION: 05/13/2023 Overall cognitive status: WFL     SENSATION: 05/13/2023 WFL  POSTURE: 05/13/2023 Rounded shoulders and forward head  UPPER EXTREMITY ROM:   ROM Right 05/13/23 Left 05/13/23  Shoulder flexion 138 150  Shoulder extension 152 164  Shoulder abduction    Shoulder adduction    Shoulder internal rotation 70 70  Shoulder external rotation 60 75  Elbow flexion    Elbow extension    Wrist flexion    Wrist extension    Wrist ulnar deviation    Wrist radial deviation    Wrist pronation    Wrist supination    (Blank rows = not tested)  UPPER EXTREMITY MMT:  MMT Right 05/13/23 Left 05/13/23 Right 05/27/2023  Shoulder flexion 4+ 5 5/5  Shoulder extension 5 5 5/5  Shoulder abduction 4+ 5 5/5  Shoulder adduction     Shoulder internal rotation 5 5   Shoulder external rotation 5 5   Middle trapezius     Lower trapezius     Elbow flexion     Elbow extension     Wrist flexion     Wrist extension     Wrist ulnar deviation     Wrist radial deviation     Wrist pronation      Wrist supination     Grip strength (lbs)     (Blank rows = not tested)  SHOULDER SPECIAL TESTS: 05/13/2023 Negative impingement test   JOINT MOBILITY TESTING:  05/13/2023 Tightness in pt's Rt upper trap and Rhomboids  PALPATION:  05/13/2023 TTP Rt upper trap, Rhomboids, and levator scapulae  TODAY'S TREATMENT:                                                                                                        DATE:  06/02/2022 Manual:  Skilled palpation of active trigger points with compression IASTM to Rt shoulder and upper trap, Rt rhomboids, Rt levator TherEx:  Rows: blue TB  x 20 holding 3 seconds Shoulder extension: blue TB x 20 holding 3 sec IR stretch in standing using strap x 3 holding 20 sec UBE: level 3 x 3 minutes each direction Edu: we discussed DN and handout issued. Pt wishing to hold on DN today's visit. Pt stating he may consider for next visit.         DATE:  06/02/2022 Manual  Rt periscapular Soft tissue mobilization and IASTM with graston tool gently to tolerance. Therex: UBE fwd/back 3 mins each way lvl 3.0 with 30 sec rest between directions Rows blue 2X15 Shoulder extensions blue 2X15 Blue band bilat  ER at same time 2 x 10 bilateral with cues for home use.  Blue band horizontal abduction 2X10 HEP additions reviewed and printed  Moist heat X 7  min not included in treatment time   DATE:  05/27/2023 Manual Percussive device to Rt thoracic paraspinals, Rt upper trap, rhomboids, infraspinatus.  Performed same as trial to Lt.   Therex: UBE fwd/back 3 mins each way lvl 3.0 with 30 sec rest between directions Lat pull down blue band 2 x 15 seated Blue band ER c towel under arm 2 x 10 bilateral with cues for home use.  Additional time spent in review of  existing HEP and advice on slow return to home exercise routine including dips/ pull ups.  Discussed leg assisted concentric movement to help ease difficulty when he does return to activity.         PATIENT EDUCATION: Education details: HEP, POC, DN handout issued Person educated: Patient Education method: Explanation, Demonstration, Verbal cues, and Handouts Education comprehension: verbalized understanding, returned demonstration, and verbal cues required  HOME EXERCISE PROGRAM: Access Code: [email or web address] URL: [email or web address] Date: 06/03/2023 Prepared by: Redell Moose  Exercises - Seated Upper Trap Stretch  - 2 x daily - 7 x weekly - 3-5 reps - 10 seconds hold - Standing Lower Cervical and Upper Thoracic Stretch  - 2 x daily - 7 x weekly - 3-5 reps - 5 seconds hold - Gentle Levator Scapulae Stretch  - 2 x daily - 7 x weekly - 3-5 reps - 5 seconds hold - Standing Shoulder Row with Anchored Resistance  - 2 x daily - 7 x weekly - 2 sets - 10 reps - 3 seconds hold - Supine Scapular Protraction in Flexion with Dumbbells  - 2 x daily - 7 x weekly - 2 sets - 10 reps - Supine Chin Tuck  - 2 x daily - 7 x weekly - 10 reps - 5 seconds hold - Doorway Rhomboid Stretch  - 2 x daily - 7 x weekly - 3-5 reps - 10 seconds hold - Shoulder External Rotation with Anchored  Resistance (Mirrored)  - 2 x daily - 7 x weekly - 3 sets - 10 reps - Shoulder extension with resistance - Neutral  - 1 x daily - 6 x weekly - 2-3 sets - 10 reps - Shoulder External Rotation and Scapular Retraction with Resistance  - 1 x daily - 6 x weekly - 2-3 sets - 10 reps - Shoulder External Rotation and Scapular Retraction with Resistance  - 1 x daily - 6 x weekly - 2-3 sets - 10 reps - Standing Shoulder Horizontal Abduction with Resistance  - 1 x daily - 6 x weekly - 2-3 sets - 10 reps  ASSESSMENT:  CLINICAL IMPRESSION: Pt considering  DN, but wished to hold it this visit. Pt with good tolerance to treatment.  Pt reporting the STM helped in his previous visit. Recommend continued skilled PT interventions.   OBJECTIVE IMPAIRMENTS: decreased mobility, decreased ROM, decreased strength, increased edema, impaired flexibility, and impaired UE functional use.   ACTIVITY LIMITATIONS: lifting, sleeping, bathing, toileting, dressing, reach over head, and hygiene/grooming  PARTICIPATION LIMITATIONS: cleaning, laundry, community activity, occupation, and yard work  PERSONAL FACTORS:  see pertinent history above  are also affecting patient's functional outcome.   REHAB POTENTIAL: Good  CLINICAL DECISION MAKING: Stable/uncomplicated  EVALUATION COMPLEXITY: Low   GOALS: Goals reviewed with patient? Yes  SHORT TERM GOALS: (target date for Short term goals are 3 weeks 06/03/2023)  1.Patient will demonstrate independent use of home exercise program to maintain progress from in clinic treatments. Goal status: MET 06/10/23  LONG TERM GOALS: (target dates for all long term goals are 10 weeks  07/22/2023)   1. Patient will demonstrate/report pain at worst less than or equal to 2/10 to facilitate minimal limitation in daily activity secondary to pain symptoms. Goal status: New   2. Patient will demonstrate independent use of home exercise program to facilitate ability to maintain/progress functional gains from skilled physical therapy services. Goal status: New   3. Patient will demonstrate FOTO outcome > or = 75 % to indicate reduced disability due to condition. Goal status: New   4.  Patient will demonstrate Rt UE MMT 5/5 throughout to facilitate lifting, reaching, carrying at [facility] in daily activity.   Goal status: New   5.  Patient will demonstrate Rt GH joint AROM WFL s symptoms to facilitate usual overhead reaching, self care, dressing at PLOF.    Goal status: New   6.  pt will be able to improve his Rt shoulder flexion to >/= 150 degrees in order to improve functional mobility.  Goal status: New    7.  Pt will be able to lift 10# from counter height to overhead shelf with pain </= 2/10 in her Rt UE.  Goal Status: New  PLAN:  PT FREQUENCY: 1x/week  PT DURATION: 8 weeks (07/08/2023)    PLANNED INTERVENTIONS: Can include 02853- PT Re-evaluation, 97110-Therapeutic exercises, 97530- Therapeutic activity, 97112- Neuromuscular re-education, 97535- Self Care, 97140- Manual therapy, [id number]- Gait training, [phone]- Orthotic Fit/training, [phone]- Canalith repositioning, [id number]- Aquatic Therapy, 97014- Electrical stimulation (unattended), [id number]- Electrical stimulation (manual), [id number]- Vasopneumatic device, [id number]- Ultrasound, [id number]- Traction (mechanical), [id number]- Ionotophoresis 4mg /ml Dexamethasone, Patient/Family education, Balance training, Stair training, Taping, Dry Needling, Joint mobilization, Joint manipulation, Spinal manipulation, Spinal mobilization, Scar mobilization, Vestibular training, Visual/preceptual remediation/compensation, DME instructions, Cryotherapy, and Moist heat.  All performed as medically necessary.  All included unless contraindicated  PLAN FOR NEXT SESSION: Manual as needed Consider DN  No percussion per pt request    Delon  Gladis, PT MPT 06/10/23 3:19 PM   06/10/23 3:19 PM

## 2023-06-10 NOTE — Patient Instructions (Signed)

## 2023-06-11 ENCOUNTER — Encounter: Payer: No Typology Code available for payment source | Admitting: Primary Care

## 2023-06-17 ENCOUNTER — Encounter: Payer: No Typology Code available for payment source | Admitting: Physical Therapy

## 2023-06-24 ENCOUNTER — Telehealth: Payer: Self-pay

## 2023-06-24 ENCOUNTER — Encounter: Payer: No Typology Code available for payment source | Admitting: Physical Therapy

## 2023-06-24 NOTE — Telephone Encounter (Signed)
Called pt and schedule F/U with [facility]

## 2023-06-24 NOTE — Telephone Encounter (Signed)
Please call and scheduled patient a f/u with Dr. Patsy Lager for shoulder

## 2023-06-24 NOTE — Telephone Encounter (Signed)
Copied from CRM [id number]. Topic: General - Other >> Jun 24, 2023 12:25 PM Elizebeth Brooking wrote: Reason for CRM: Patient called in to see if he can get an appointment to schedule a xray on his shoulder, is requesting a callback

## 2023-06-30 NOTE — Progress Notes (Unsigned)
   Eithel Ryall T. Turhan Chill, MD, CAQ Sports Medicine [facility] [address] [place], [address]  Phone: [phone]  FAX: [phone]  Bernardino Dowell. - 63 y.o. male  MRN [id number]  Date of Birth: 1960/06/02  Date: 07/01/2023  PCP: Doreene Nest, NP  Referral: Doreene Nest, NP  No chief complaint on file.  Subjective:   Shun Pletz. is a 63 y.o. very pleasant male patient with There is no height or weight on file to calculate BMI. who presents with the following:  He is a pleasant gentleman who I recall quite well.  Vocationally, he is a Engineer, civil (consulting).  I saw him in November for some right-sided shoulder pain and felt to have a trapezius strain.  At that point, thought he would do fairly well and did give him some Flexeril along with some physical therapy.    Review of Systems is noted in the HPI, as appropriate  Objective:   There were no vitals taken for this visit.  GEN: No acute distress; alert,appropriate. PULM: Breathing comfortably in no respiratory distress PSYCH: Normally interactive.   Laboratory and Imaging Data:  Assessment and Plan:   ***

## 2023-07-01 ENCOUNTER — Ambulatory Visit (INDEPENDENT_AMBULATORY_CARE_PROVIDER_SITE_OTHER): Payer: No Typology Code available for payment source | Admitting: Family Medicine

## 2023-07-01 ENCOUNTER — Ambulatory Visit
Admission: RE | Admit: 2023-07-01 | Discharge: 2023-07-01 | Disposition: A | Discharge status: Home / Self Care | Payer: No Typology Code available for payment source | Source: Ambulatory Visit | Attending: Family Medicine | Admitting: Family Medicine

## 2023-07-01 ENCOUNTER — Encounter: Payer: Self-pay | Admitting: Family Medicine

## 2023-07-01 VITALS — BP 100/60 | HR 83 | Temp 97.8°F | Ht 70.0 in | Wt 237.2 lb

## 2023-07-01 DIAGNOSIS — M25511 Pain in right shoulder: Secondary | ICD-10-CM

## 2023-07-01 DIAGNOSIS — S46811A Strain of other muscles, fascia and tendons at shoulder and upper arm level, right arm, initial encounter: Secondary | ICD-10-CM | POA: Diagnosis not present

## 2023-07-01 MED ORDER — CYCLOBENZAPRINE HCL 10 MG PO TABS: 5.0000 mg | ORAL_TABLET | Freq: Every day | ORAL | 3 refills | Status: DC | PRN

## 2023-07-02 ENCOUNTER — Encounter: Payer: No Typology Code available for payment source | Admitting: Primary Care

## 2023-07-08 ENCOUNTER — Encounter: Payer: No Typology Code available for payment source | Admitting: Physical Therapy

## 2023-07-22 ENCOUNTER — Ambulatory Visit (INDEPENDENT_AMBULATORY_CARE_PROVIDER_SITE_OTHER): Payer: No Typology Code available for payment source | Admitting: Physical Therapy

## 2023-07-22 ENCOUNTER — Encounter: Payer: Self-pay | Admitting: Physical Therapy

## 2023-07-22 DIAGNOSIS — M5459 Other low back pain: Secondary | ICD-10-CM | POA: Diagnosis not present

## 2023-07-22 DIAGNOSIS — R29898 Other symptoms and signs involving the musculoskeletal system: Secondary | ICD-10-CM

## 2023-07-22 DIAGNOSIS — M6281 Muscle weakness (generalized): Secondary | ICD-10-CM | POA: Diagnosis not present

## 2023-07-22 DIAGNOSIS — M25511 Pain in right shoulder: Secondary | ICD-10-CM

## 2023-07-22 DIAGNOSIS — R293 Abnormal posture: Secondary | ICD-10-CM | POA: Diagnosis not present

## 2023-07-22 NOTE — Therapy (Signed)
 OUTPATIENT PHYSICAL THERAPY TREATMENT Discharge   Patient Name: Stephen Ramos. MRN: [id number] DOB:January 20, 1961, 63 y.o., male Today's Date: 07/22/2023  END OF SESSION:  PT End of Session - 07/22/23 0856     Visit Number 5    Number of Visits 8    Date for PT Re-Evaluation 07/08/23    PT Start Time 0850    PT Stop Time 0928    PT Time Calculation (min) 38 min    Activity Tolerance Patient tolerated treatment well    Behavior During Therapy [facility] for tasks assessed/performed                Past Medical History:  Diagnosis Date   Chicken pox    Heart murmur 2011   Hordeolum internum left lower eyelid 10/11/2016   Pain in left thigh 09/18/2021   Plantar wart of right foot 05/11/2020   Prediabetes    Right shoulder pain 06/24/2020   Vertigo    Past Surgical History:  Procedure Laterality Date   CHOLECYSTECTOMY  2012   Patient Active Problem List   Diagnosis Date Noted   Vertigo 01/21/2023   Hyperlipidemia 03/22/2016   Prediabetes 03/22/2016   Preventative health care 12/27/2014   Obesity (BMI 30.0-34.9) 12/15/2014    PCP: Doreene Nest, NP   REFERRING PROVIDER: Hannah Beat, MD   REFERRING DIAG:  M25.511 (ICD-10-CM) - Acute pain of right shoulder  S46.811A (ICD-10-CM) - Strain of right trapezius muscle, initial encounter    THERAPY DIAG:  Acute pain of right shoulder  Muscle weakness (generalized)  Abnormal posture  Other low back pain  Other symptoms and signs involving the musculoskeletal system  Rationale for Evaluation and Treatment: Rehabilitation  ONSET DATE:   SUBJECTIVE:  SUBJECTIVE STATEMENT: Pt arriving today with no pain.   PERTINENT HISTORY: Heart murmur, pre DM, vertigo   PAIN:  NPRS scale: 0 Pain location: Rt shoulder  Pain description: dull pain Aggravating factors: pushing arm forward Relieving factors: muscle relaxor  PRECAUTIONS: None  WEIGHT BEARING RESTRICTIONS: No  FALLS:  Has patient fallen in last 6 months? 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: Independent  PATIENT GOALS:be able to work out without pain  OBJECTIVE:   DIAGNOSTIC FINDINGS:  05/13/2023 review:  No images of pt's shoulder  PATIENT SURVEYS:  05/13/23:  FOTO intake:  62%  07/22/23: FOTO update 87%   COGNITION: 05/13/2023 Overall cognitive status: WFL     SENSATION: 05/13/2023 WFL  POSTURE: 05/13/2023 Rounded shoulders and forward head  UPPER EXTREMITY ROM:   ROM Right 05/13/23 Left 05/13/23 Rt 07/22/23  Shoulder flexion 138 150 168  Shoulder abduction 152 164 165       Shoulder adduction     Shoulder internal rotation 70 70 72  Shoulder external rotation 60 75 74  Elbow flexion     Elbow extension     Wrist flexion     Wrist extension     Wrist ulnar deviation     Wrist radial deviation     Wrist pronation     Wrist supination     (Blank rows = not tested)  UPPER EXTREMITY MMT:  MMT Right 05/13/23 Left 05/13/23 Right 05/27/2023 Rt 07/22/23  Shoulder flexion 4+ 5 5/5 5  Shoulder extension 5 5 5/5 5  Shoulder abduction 4+ 5 5/5 5  Shoulder adduction      Shoulder internal rotation 5 5  5   Shoulder external rotation 5 5  5   Middle trapezius      Lower trapezius      Elbow flexion      Elbow extension      Wrist flexion      Wrist extension      Wrist ulnar deviation      Wrist radial deviation      Wrist pronation      Wrist supination      Grip  strength (lbs)      (Blank rows = not tested)  SHOULDER SPECIAL TESTS: 05/13/2023 Negative impingement test   JOINT MOBILITY TESTING:  05/13/2023 Tightness in pt's Rt upper trap and Rhomboids  PALPATION:  05/13/2023 TTP Rt upper trap, Rhomboids, and levator scapulae  TODAY'S TREATMENT:                                                                                                        DATE:  07/22/23 TherEx:  Rows: blue TB x20 holding 5 sec Shoulder Ext 3 # x 15 Shoulder Abd 3 # bar x 10 Shoulder flexion 3# bar x 10 Reviewed all pt's HEP and updated  Radial, Median and Ulnar nerve flossing x 10 each Cervical traction using towel roll Self Care:  Posture when lifting his pediatric patient for bathing, we discussed lunge position and weight centered close to his center of gravity for safety    TODAY'S TREATMENT:                                                                                                        DATE:  06/02/2022 Manual:  Skilled palpation of active trigger points with compression IASTM to Rt shoulder and upper trap, Rt rhomboids, Rt levator TherEx:  Rows: blue TB  x 20 holding 3 seconds Shoulder extension: blue TB x 20 holding 3 sec IR stretch in standing using strap x 3 holding 20 sec UBE: level 3 x 3 minutes each direction Edu: we discussed DN and handout issued. Pt wishing to hold on DN today's visit. Pt stating he may consider for next visit.      DATE:  06/02/2022 Manual  Rt periscapular Soft tissue mobilization and IASTM with graston tool gently to tolerance. Therex: UBE fwd/back 3 mins each way lvl 3.0 with 30 sec rest between directions Rows blue 2X15 Shoulder extensions blue 2X15 Blue band bilat  ER at same time 2 x 10 bilateral with cues for home use.   Blue band horizontal abduction 2X10 HEP additions reviewed and printed  Moist heat X 7  min not included in treatment time     DATE:  05/27/2023 Manual Percussive device to Rt thoracic paraspinals, Rt upper trap, rhomboids, infraspinatus.  Performed same as trial to Lt.   Therex: UBE fwd/back 3 mins each way lvl 3.0 with 30 sec rest between directions Lat pull down blue band 2 x 15 seated Blue band ER c towel under arm 2 x 10 bilateral with cues for home use.  Additional time spent in review of existing HEP and advice on slow return to home exercise routine including dips/ pull ups.  Discussed leg assisted concentric movement to help ease difficulty when he does return to activity.         PATIENT EDUCATION: Access Code: [email or web address] URL: [email or web address] Date: 07/22/2023 Prepared by: Narda Amber  Exercises - Seated Upper Trap Stretch  - 2  x daily - 7 x weekly - 3-5 reps - 10 seconds hold - Standing Lower Cervical and Upper Thoracic Stretch  - 2 x daily - 7 x weekly - 3-5 reps - 5 seconds hold - Gentle Levator Scapulae Stretch  - 2 x daily - 7 x weekly - 3-5 reps - 5 seconds hold - Standing Shoulder Row with Anchored Resistance  - 2 x daily - 7 x weekly - 2 sets - 10 reps - 3 seconds hold - Supine Scapular Protraction in Flexion with Dumbbells  - 2 x daily - 7 x weekly - 2 sets - 10 reps - Supine Chin Tuck  - 2 x daily - 7 x weekly - 10 reps - 5 seconds hold - Doorway Rhomboid Stretch  - 2 x daily - 7 x weekly - 3-5 reps - 10 seconds hold - Shoulder External Rotation with Anchored Resistance (Mirrored)  - 2 x daily - 7 x weekly - 3 sets - 10 reps - Shoulder extension with resistance - Neutral  - 1 x daily - 6 x weekly - 2-3 sets - 10 reps - Shoulder External Rotation and Scapular Retraction with Resistance  - 1 x daily - 6 x weekly - 2-3 sets - 10 reps - Shoulder External Rotation and Scapular Retraction with Resistance  - 1 x daily - 6 x weekly - 2-3 sets  - 10 reps - Standing Shoulder Horizontal Abduction with Resistance  - 1 x daily - 6 x weekly - 2-3 sets - 10 reps - Median Nerve Glide  - 1 x daily - 7 x weekly - 10 reps - Ulnar Nerve Flossing  - 1 x daily - 7 x weekly - 10 reps - Standing Radial Nerve Glide  - 1 x daily - 7 x weekly - 10 reps - Seated Cervical Traction  - 1 x daily - 7 x weekly - 5 reps - 5 seconds hold  ASSESSMENT:  CLINICAL IMPRESSION: Pt reporting good response to his HEP and he added nerve flossing and manual cervical traction which has helped significantly. Pt has improved his FOTO score to 87%. Pt has also improved his ROM and Strength. Pt is being discharged from skilled PT services with all goals met.   OBJECTIVE IMPAIRMENTS: decreased mobility, decreased ROM, decreased strength, increased edema, impaired flexibility, and impaired UE functional use.   ACTIVITY LIMITATIONS: lifting, sleeping, bathing, toileting, dressing, reach over head, and hygiene/grooming  PARTICIPATION LIMITATIONS: cleaning, laundry, community activity, occupation, and yard work  PERSONAL FACTORS:  see pertinent history above  are also affecting patient's functional outcome.   REHAB POTENTIAL: Good  CLINICAL DECISION MAKING: Stable/uncomplicated  EVALUATION COMPLEXITY: Low   GOALS: Goals reviewed with patient? Yes  SHORT TERM GOALS: (target date for Short term goals are 3 weeks 06/03/2023)  1.Patient will demonstrate independent use of home exercise program to maintain progress from in clinic treatments. Goal status: MET 06/10/23  LONG TERM GOALS: (target dates for all long term goals are 10 weeks  07/22/2023)   1. Patient will demonstrate/report pain at worst less than or equal to 2/10 to facilitate minimal limitation in daily activity secondary to pain symptoms. Goal status: MET 07/22/23   2. Patient will demonstrate independent use of home exercise program to facilitate ability to maintain/progress functional gains from skilled  physical therapy services. Goal status:MET 07/22/23   3. Patient will demonstrate FOTO outcome > or = 75 % to indicate reduced disability due to condition. Goal status: MET 07/22/23  4.  Patient will demonstrate Rt UE MMT 5/5 throughout to facilitate lifting, reaching, carrying at [facility] in daily activity.   Goal status: MET 07/22/23   5.  Patient will demonstrate Rt GH joint AROM WFL s symptoms to facilitate usual overhead reaching, self care, dressing at PLOF.    Goal status: MET 07/22/23   6.  pt will be able to improve his Rt shoulder flexion to >/= 150 degrees in order to improve functional mobility.  Goal status: MET 07/22/23   7.  Pt will be able to lift 10# from counter height to overhead shelf with pain </= 2/10 in her Rt UE.  Goal Status: MET 07/22/23  PLAN:  PT FREQUENCY: 1x/week  PT DURATION: 8 weeks (07/08/2023)    PLANNED INTERVENTIONS: Can include 62952- PT Re-evaluation, 97110-Therapeutic exercises, 97530- Therapeutic activity, 97112- Neuromuscular re-education, 97535- Self Care, 97140- Manual therapy, [id number]- Gait training, [phone]- Orthotic Fit/training, [phone]- Canalith repositioning, [id number]- Aquatic Therapy, 97014- Electrical stimulation (unattended), [id number]- Electrical stimulation (manual), [id number]- Vasopneumatic device, [id number]- Ultrasound, [id number]- Traction (mechanical), [id number]- Ionotophoresis 4mg /ml Dexamethasone, Patient/Family education, Balance training, Stair training, Taping, Dry Needling, Joint mobilization, Joint manipulation, Spinal manipulation, Spinal mobilization, Scar mobilization, Vestibular training, Visual/preceptual remediation/compensation, DME instructions, Cryotherapy, and Moist heat.  All performed as medically necessary.  All included unless contraindicated  PLAN FOR NEXT SESSION: Discharge    Narda Amber, PT MPT 07/22/23 9:44 AM   PHYSICAL THERAPY DISCHARGE SUMMARY  Visits from Start of Care: 5  Current functional level related to goals /  functional outcomes: See above   Remaining deficits: See above   Education / Equipment: HEP and lifting and posture techniques   Patient agrees to discharge. Patient goals were met. Patient is being discharged due to meeting the stated rehab goals.   07/22/23 9:44 AM

## 2023-08-20 ENCOUNTER — Ambulatory Visit (INDEPENDENT_AMBULATORY_CARE_PROVIDER_SITE_OTHER): Admitting: Primary Care

## 2023-08-20 ENCOUNTER — Ambulatory Visit: Attending: Primary Care

## 2023-08-20 ENCOUNTER — Encounter: Payer: Self-pay | Admitting: Primary Care

## 2023-08-20 VITALS — BP 102/64 | HR 62 | Temp 97.4°F | Ht 70.0 in | Wt 235.0 lb

## 2023-08-20 DIAGNOSIS — E782 Mixed hyperlipidemia: Secondary | ICD-10-CM | POA: Diagnosis not present

## 2023-08-20 DIAGNOSIS — Z125 Encounter for screening for malignant neoplasm of prostate: Secondary | ICD-10-CM

## 2023-08-20 DIAGNOSIS — R7303 Prediabetes: Secondary | ICD-10-CM

## 2023-08-20 DIAGNOSIS — Z8249 Family history of ischemic heart disease and other diseases of the circulatory system: Secondary | ICD-10-CM

## 2023-08-20 DIAGNOSIS — R002 Palpitations: Secondary | ICD-10-CM | POA: Diagnosis not present

## 2023-08-20 DIAGNOSIS — Z0001 Encounter for general adult medical examination with abnormal findings: Secondary | ICD-10-CM

## 2023-08-20 LAB — CBC
HCT: 43.9 % (ref 39.0–52.0)
Hemoglobin: 14.6 g/dL (ref 13.0–17.0)
MCHC: 33.3 g/dL (ref 30.0–36.0)
MCV: 90.7 fl (ref 78.0–100.0)
Platelets: 204 10*3/uL (ref 150.0–400.0)
RBC: 4.84 Mil/uL (ref 4.22–5.81)
RDW: 12.9 % (ref 11.5–15.5)
WBC: 4.8 10*3/uL (ref 4.0–10.5)

## 2023-08-20 LAB — LIPID PANEL
Cholesterol: 180 mg/dL (ref 0–200)
HDL: 39.8 mg/dL (ref >39.00)
LDL Cholesterol: 114 mg/dL — ABNORMAL HIGH (ref 0–99)
NonHDL: 140.54
Total CHOL/HDL Ratio: 5
Triglycerides: 135 mg/dL (ref 0.0–149.0)
VLDL: 27 mg/dL (ref 0.0–40.0)

## 2023-08-20 LAB — COMPREHENSIVE METABOLIC PANEL
ALT: 31 U/L (ref 0–53)
AST: 21 U/L (ref 0–37)
Albumin: 4.5 g/dL (ref 3.5–5.2)
Alkaline Phosphatase: 45 U/L (ref 39–117)
BUN: 22 mg/dL (ref 6–23)
CO2: 29 meq/L (ref 19–32)
Calcium: 9.9 mg/dL (ref 8.4–10.5)
Chloride: 104 meq/L (ref 96–112)
Creatinine, Ser: 1.16 mg/dL (ref 0.40–1.50)
GFR: 68.46 mL/min (ref >60.00)
Glucose, Bld: 89 mg/dL (ref 70–99)
Potassium: 4.6 meq/L (ref 3.5–5.1)
Sodium: 138 meq/L (ref 135–145)
Total Bilirubin: 0.6 mg/dL (ref 0.2–1.2)
Total Protein: 7.3 g/dL (ref 6.0–8.3)

## 2023-08-20 LAB — TSH: TSH: 1.43 u[IU]/mL (ref 0.35–5.50)

## 2023-08-20 LAB — HEMOGLOBIN A1C: Hgb A1c MFr Bld: 5.8 % (ref 4.6–6.5)

## 2023-08-20 LAB — PSA: PSA: 0.99 ng/mL (ref 0.10–4.00)

## 2023-08-20 NOTE — Addendum Note (Signed)
 Addended by: Doreene Nest on: 08/20/2023 10:56 AM   Modules accepted: Orders

## 2023-08-20 NOTE — Patient Instructions (Signed)
 Stop by the lab prior to leaving today. I will notify you of your results once received.   The Holter monitor will come to your house in a few days.  Please wear this for 14 days and then ship it back off.  I will be in touch once I receive the results.  You will receive a phone call regarding the CT scan of the heart.  It was a pleasure to see you today!

## 2023-08-20 NOTE — Assessment & Plan Note (Signed)
 Repeat A1c pending

## 2023-08-20 NOTE — Assessment & Plan Note (Signed)
 Repeat lipid panel pending.  CT coronary calcium score scan ordered and pending, especially given family history. He agrees.

## 2023-08-20 NOTE — Assessment & Plan Note (Addendum)
 EKG today with sinus bradycardia, rate of 54, no acute ST changes, PAC/PVCs. No old EKG within the system to compare.  Checking labs today including TSH, CBC, CMP, A1c. Holter monitor ordered and pending.  Decrease caffeine use.  Await results.

## 2023-08-20 NOTE — Assessment & Plan Note (Signed)
 Orders placed for coronary calcium CT scan.  Checking lipid panel and A1c.

## 2023-08-20 NOTE — Assessment & Plan Note (Signed)
Declines Shingrix vaccines Colonoscopy UTD, due 2026 PSA due and pending.  Discussed the importance of a healthy diet and regular exercise in order for weight loss, and to reduce the risk of further co-morbidity.  Exam stable. Labs pending.  Follow up in 1 year for repeat physical.

## 2023-08-20 NOTE — Progress Notes (Signed)
 Subjective:    Patient ID: Stephen Casino., male    DOB: [birth year], 63 y.o.   MRN: [id number]  HPI  Stephen Nohr. is a very pleasant 63 y.o. male who presents today for complete physical and follow up of chronic conditions.  He would also like to discuss several concerns.   About 2 weeks ago he developed palpitations intermittent heart palpitations after eating several chocolate bars over several days. Palpitations occur in the morning for about 15 minutes. The palpitations occur once every 3 hours with a feeling of lethargy, last about 15 minutes. His last episode of palpitations was this morning. He denies shortness of breath, chest pain, diaphoresis. He stopped drinking coffee 6 months. He drinks unsweet tea twice weekly.   He has a family history of type 2 diabetes in both parents. His father has a history of myocardial infarction at age 9, 2 strokes, hypertension, and kidney disease. His father passed from renal failure. The patient's last lipid panel from November 2023 showed LDL of 145, HDL of 46, trigs of 103.   Immunizations: -Tetanus: Completed in 2019 -Influenza: Completed last season  -Shingles: Never completed Shingrix series. Declines today.   Diet: Fair diet.  Exercise: No regular exercise.  Eye exam: Completes annually  Dental exam: Completes semi-annually    Colonoscopy: Completed in 2016, due 2026  PSA: Due      Review of Systems  Constitutional:  Positive for fatigue. Negative for unexpected weight change.  HENT:  Negative for rhinorrhea.   Respiratory:  Negative for cough and shortness of breath.   Cardiovascular:  Positive for palpitations. Negative for chest pain.  Gastrointestinal:  Negative for constipation and diarrhea.  Genitourinary:  Negative for difficulty urinating.  Musculoskeletal:  Negative for arthralgias and myalgias.  Skin:  Negative for rash.  Allergic/Immunologic: Negative for environmental allergies.  Neurological:   Negative for dizziness, numbness and headaches.  Psychiatric/Behavioral:  The patient is not nervous/anxious.          Past Medical History:  Diagnosis Date   Chicken pox    Heart murmur 2011   Hordeolum internum left lower eyelid 10/11/2016   Pain in left thigh 09/18/2021   Plantar wart of right foot 05/11/2020   Prediabetes    Right shoulder pain 06/24/2020   Vertigo     Social History   Socioeconomic History   Marital status: Single    Spouse name: Not on file   Number of children: Not on file   Years of education: Not on file   Highest education level: Bachelor's degree (e.g., BA, AB, BS)  Occupational History   Not on file  Tobacco Use   Smoking status: Never   Smokeless tobacco: Never  Substance and Sexual Activity   Alcohol use: No    Alcohol/week: 0.0 standard drinks of alcohol   Drug use: No   Sexual activity: Not on file  Other Topics Concern   Not on file  Social History Narrative   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   Works as a Engineer, civil (consulting).   Highest level of education is bachelors.   Works for [employer].   Enjoys traveling, reading, collecting coins.   Social Drivers of Corporate investment banker Strain: Low Risk  (06/30/2023)   Overall Financial Resource Strain (CARDIA)    Difficulty of Paying Living Expenses: Not hard at all  Food Insecurity: No Food Insecurity (06/30/2023)   Hunger Vital Sign    Worried About Running Out of Food in  the Last Year: Never true    Ran Out of Food in the Last Year: Never true  Transportation Needs: No Transportation Needs (06/30/2023)   PRAPARE - Administrator, Civil Service (Medical): No    Lack of Transportation (Non-Medical): No  Physical Activity: Insufficiently Active (06/30/2023)   Exercise Vital Sign    Days of Exercise per Week: 3 days    Minutes of Exercise per Session: 30 min  Stress: No Stress Concern Present (06/30/2023)   [employer] of Occupational Health - Occupational Stress Questionnaire     Feeling of Stress : Not at all  Social Connections: Moderately Integrated (06/30/2023)   Social Connection and Isolation Panel [NHANES]    Frequency of Communication with Friends and Family: More than three times a week    Frequency of Social Gatherings with Friends and Family: Once a week    Attends Religious Services: More than 4 times per year    Active Member of [employer] or Organizations: Yes    Attends Engineer, structural: More than 4 times per year    Marital Status: Divorced  Catering manager Violence: Not on file    Past Surgical History:  Procedure Laterality Date   CHOLECYSTECTOMY  2012    Family History  Problem Relation Age of Onset   Diabetes Mother    Diabetes Father    Heart disease Father 72       Myocardial   Kidney disease Father    Stroke Father    Hypertension Father    Colon cancer Neg Hx     No Known Allergies  Current Outpatient Medications on File Prior to Visit  Medication Sig Dispense Refill   MULTIPLE VITAMIN PO Take 1 tablet by mouth every other day.      No current facility-administered medications on file prior to visit.    BP 102/64   Pulse 62   Temp (!) 97.4 F (36.3 C) (Temporal)   Ht 5\' 10"  (1.778 m)   Wt 235 lb (106.6 kg)   SpO2 100%   BMI 33.72 kg/m  Objective:   Physical Exam HENT:     Right Ear: Tympanic membrane and ear canal normal.     Left Ear: Tympanic membrane and ear canal normal.  Eyes:     Pupils: Pupils are equal, round, and reactive to light.  Cardiovascular:     Rate and Rhythm: Normal rate and regular rhythm.  Pulmonary:     Effort: Pulmonary effort is normal.     Breath sounds: Normal breath sounds.  Abdominal:     General: Bowel sounds are normal.     Palpations: Abdomen is soft.     Tenderness: There is no abdominal tenderness.  Musculoskeletal:        General: Normal range of motion.     Cervical back: Neck supple.  Skin:    General: Skin is warm and dry.  Neurological:     Mental Status:  He is alert and oriented to person, place, and time.     Cranial Nerves: No cranial nerve deficit.     Deep Tendon Reflexes:     Reflex Scores:      Patellar reflexes are 2+ on the right side and 2+ on the left side. Psychiatric:        Mood and Affect: Mood normal.           Assessment & Plan:  Encounter for annual general medical examination with abnormal findings in adult  Assessment & Plan: Declines Shingrix vaccines Colonoscopy UTD, due 2026 PSA due and pending.  Discussed the importance of a healthy diet and regular exercise in order for weight loss, and to reduce the risk of further co-morbidity.  Exam stable. Labs pending.  Follow up in 1 year for repeat physical.    Palpitations Assessment & Plan: EKG today with sinus bradycardia, rate of 54, no acute ST changes, PAC/PVCs. No old EKG within the system to compare.  Checking labs today including TSH, CBC, CMP, A1c. Holter monitor ordered and pending.  Decrease caffeine use.  Await results.  Orders: -     CBC -     TSH -     LONG TERM MONITOR (3-14 DAYS); Future -     EKG 12-Lead  Prediabetes Assessment & Plan: Repeat A1c pending.  Orders: -     Hemoglobin A1c  Mixed hyperlipidemia Assessment & Plan: Repeat lipid panel pending.  CT coronary calcium score scan ordered and pending, especially given family history. He agrees.  Orders: -     Lipid panel -     Comprehensive metabolic panel -     CT CARDIAC SCORING (SELF PAY ONLY); Future  Screening for prostate cancer -     PSA  Family history of early CAD Assessment & Plan: Orders placed for coronary calcium CT scan.  Checking lipid panel and A1c.  Orders: -     CT CARDIAC SCORING (SELF PAY ONLY); Future        Doreene Nest, NP

## 2023-08-22 ENCOUNTER — Telehealth: Payer: Self-pay

## 2023-08-22 NOTE — Telephone Encounter (Signed)
 Copied from CRM [id number]. Topic: General - Other >> Aug 22, 2023  1:03 PM Lovey Newcomer R wrote: Reason for CRM: Pt received his monitor today and wants to ask the Dr how many days does he need to wear it? He has a scan that is scheduled for Monday at 10am and he doesn't want to start it and have to stop it and mess up. Please follow up asap and clarify.

## 2023-08-22 NOTE — Telephone Encounter (Signed)
 As discussed at his visit, he will wear the Holter monitor for 14 days. He can wait to apply the Holter monitor until he completes his CT scan.

## 2023-08-23 NOTE — Telephone Encounter (Signed)
 Called patient and reviewed all information. Patient verbalized understanding. Will call if any further questions.

## 2023-08-26 ENCOUNTER — Ambulatory Visit
Admission: RE | Admit: 2023-08-26 | Discharge: 2023-08-26 | Disposition: A | Discharge status: Home / Self Care | Payer: Self-pay | Source: Ambulatory Visit | Attending: Primary Care | Admitting: Primary Care

## 2023-08-26 DIAGNOSIS — Z8249 Family history of ischemic heart disease and other diseases of the circulatory system: Secondary | ICD-10-CM | POA: Insufficient documentation

## 2023-08-26 DIAGNOSIS — E782 Mixed hyperlipidemia: Secondary | ICD-10-CM | POA: Insufficient documentation

## 2023-08-27 NOTE — Telephone Encounter (Signed)
 Called and spoke with patient, he requested appt with Jae Dire to discuss results of CT scan. Scheduled 4/8 @ 3:20 virtual visit. Answered questions regarding holter monitor.

## 2023-08-27 NOTE — Telephone Encounter (Signed)
 Copied from CRM [id number]. Topic: Clinical - Lab/Test Results >> Aug 27, 2023 10:16 AM Stephen Ramos wrote: Reason for CRM: Patient called stating provider sent a message regarding his lab results and would like to speak with her regarding labs and physical patient recently had. Agent see where a nurse spoke with patient previously but nothing new,. Patient also states he received the monitor to wear and instructions states register the patient online, Stephen Ramos wants to know does he need to register or is he already registered, please reach out to patient, thanks.   Stephen Ramos [phone]

## 2023-08-30 ENCOUNTER — Encounter: Payer: No Typology Code available for payment source | Admitting: Primary Care

## 2023-09-03 ENCOUNTER — Telehealth (INDEPENDENT_AMBULATORY_CARE_PROVIDER_SITE_OTHER): Admitting: Primary Care

## 2023-09-03 VITALS — BP 118/68 | HR 72 | Temp 97.2°F | Resp 18 | Ht 70.0 in | Wt 230.0 lb

## 2023-09-03 DIAGNOSIS — E782 Mixed hyperlipidemia: Secondary | ICD-10-CM | POA: Diagnosis not present

## 2023-09-03 NOTE — Progress Notes (Signed)
 Patient ID: Stephen Ramos., male    DOB: [birth year], 63 y.o.   MRN: [id number]  Virtual visit completed through [facility], a video enabled telemedicine application. Due to national recommendations of social distancing due to COVID-19, a virtual visit is felt to be most appropriate for this patient at this time. Reviewed limitations, risks, security and privacy concerns of performing a virtual visit and the availability of in person appointments. I also reviewed that there may be a patient responsible charge related to this service. The patient agreed to proceed.   Patient location: home Provider location: [place] at [facility], office Persons participating in this virtual visit: patient, provider   If any vitals were documented, they were collected by patient at home unless specified below.    BP 118/68   Pulse 72   Temp (!) 97.2 F (36.2 C) (Axillary)   Resp 18   Ht 5\' 10"  (1.778 m)   Wt 230 lb (104.3 kg)   BMI 33.00 kg/m    CC: Review CT scan results Subjective:   HPI: Stephen Ramos. is a 63 y.o. male presenting on 09/03/2023 for Results (Review CT results )  He underwent CT cardiac calcium score on 08/26/2023 which revealed a calcium score of 16.4, 43rd percentile for age and sex matched control.  The recommendation was to continue heart healthy lifestyle and risk factor modification.  The noncardiac portion of his CT scan was unremarkable except for subtle coronary calcifications.  He has questions regarding the plaque build up in the LAD. He has a family history of heart attack in his father in his early 35s. His recent lipid panel showed LDL of 114 which was reduced from 145 in 2023.  He has never smoked and does not drink alcohol. He endorses a healthy diet for most of his life. He walks and exercises at home several days weekly.   He denies exertional chest pain and shortness of breath.         Relevant past medical, surgical, family and social history reviewed  and updated as indicated. Interim medical history since our last visit reviewed. Allergies and medications reviewed and updated. Outpatient Medications Prior to Visit  Medication Sig Dispense Refill   MULTIPLE VITAMIN PO Take 1 tablet by mouth every other day.      No facility-administered medications prior to visit.     Per HPI unless specifically indicated in ROS section below Review of Systems  Respiratory:  Negative for shortness of breath.   Cardiovascular:  Negative for chest pain.  Neurological:  Negative for dizziness.  Psychiatric/Behavioral:  The patient is not nervous/anxious.    Objective:  BP 118/68   Pulse 72   Temp (!) 97.2 F (36.2 C) (Axillary)   Resp 18   Ht 5\' 10"  (1.778 m)   Wt 230 lb (104.3 kg)   BMI 33.00 kg/m   Wt Readings from Last 3 Encounters:  09/03/23 230 lb (104.3 kg)  08/20/23 235 lb (106.6 kg)  07/01/23 237 lb 4 oz (107.6 kg)       Physical exam: General: Alert and oriented x 3, no distress, does not appear sickly  Pulmonary: Speaks in complete sentences without increased work of breathing, no cough during visit.  Psychiatric: Normal mood, thought content, and behavior.     Results for orders placed or performed in visit on 08/20/23  Lipid panel   Collection Time: 08/20/23 10:54 AM  Result Value Ref Range   Cholesterol 180 0 -  200 mg/dL   Triglycerides 161.0 0.0 - 149.0 mg/dL   HDL 96.04 >54.09 mg/dL   VLDL 81.1 0.0 - 91.4 mg/dL   LDL Cholesterol 782 (H) 0 - 99 mg/dL   Total CHOL/HDL Ratio 5    NonHDL 140.54   Comprehensive metabolic panel   Collection Time: 08/20/23 10:54 AM  Result Value Ref Range   Sodium 138 135 - 145 mEq/L   Potassium 4.6 3.5 - 5.1 mEq/L   Chloride 104 96 - 112 mEq/L   CO2 29 19 - 32 mEq/L   Glucose, Bld 89 70 - 99 mg/dL   BUN 22 6 - 23 mg/dL   Creatinine, Ser 9.56 0.40 - 1.50 mg/dL   Total Bilirubin 0.6 0.2 - 1.2 mg/dL   Alkaline Phosphatase 45 39 - 117 U/L   AST 21 0 - 37 U/L   ALT 31 0 - 53 U/L    Total Protein 7.3 6.0 - 8.3 g/dL   Albumin 4.5 3.5 - 5.2 g/dL   GFR 21.30 >86.57 mL/min   Calcium 9.9 8.4 - 10.5 mg/dL  CBC   Collection Time: 08/20/23 10:54 AM  Result Value Ref Range   WBC 4.8 4.0 - 10.5 K/uL   RBC 4.84 4.22 - 5.81 Mil/uL   Platelets 204.0 150.0 - 400.0 K/uL   Hemoglobin 14.6 13.0 - 17.0 g/dL   HCT 84.6 96.2 - 95.2 %   MCV 90.7 78.0 - 100.0 fl   MCHC 33.3 30.0 - 36.0 g/dL   RDW 84.1 32.4 - 40.1 %  TSH   Collection Time: 08/20/23 10:54 AM  Result Value Ref Range   TSH 1.43 0.35 - 5.50 uIU/mL  Hemoglobin A1c   Collection Time: 08/20/23 10:54 AM  Result Value Ref Range   Hgb A1c MFr Bld 5.8 4.6 - 6.5 %  PSA   Collection Time: 08/20/23 10:54 AM  Result Value Ref Range   PSA 0.99 0.10 - 4.00 ng/mL   Assessment & Plan:   Problem List Items Addressed This Visit       Other   Hyperlipidemia - Primary   We reviewed his recent coronary CT coronary calcium scan which shows minimal plaque buildup.  We both agreed that given these results, coupled with the improved cholesterol levels this year, he would continue to work on regular exercise and a healthy diet.   We will repeat lipid panel again next year.         No orders of the defined types were placed in this encounter.  No orders of the defined types were placed in this encounter.   I discussed the assessment and treatment plan with the patient. The patient was provided an opportunity to ask questions and all were answered. The patient agreed with the plan and demonstrated an understanding of the instructions. The patient was advised to call back or seek an in-person evaluation if the symptoms worsen or if the condition fails to improve as anticipated.  Follow up plan:  Keep work on your diet. Continue to exercise regularly.   It was a pleasure to see you today!   Doreene Nest, NP

## 2023-09-03 NOTE — Patient Instructions (Signed)
 Keep work on [employer]. Continue to exercise regularly.   It was a pleasure to see you today!

## 2023-09-03 NOTE — Assessment & Plan Note (Signed)
 We reviewed his recent coronary CT coronary calcium scan which shows minimal plaque buildup.  We both agreed that given these results, coupled with the improved cholesterol levels this year, he would continue to work on regular exercise and a healthy diet.   We will repeat lipid panel again next year.

## 2023-09-18 DIAGNOSIS — R002 Palpitations: Secondary | ICD-10-CM | POA: Diagnosis not present

## 2023-09-27 ENCOUNTER — Ambulatory Visit: Admitting: Primary Care

## 2023-09-30 ENCOUNTER — Ambulatory Visit (INDEPENDENT_AMBULATORY_CARE_PROVIDER_SITE_OTHER): Admitting: Internal Medicine

## 2023-09-30 ENCOUNTER — Encounter: Payer: Self-pay | Admitting: Internal Medicine

## 2023-09-30 VITALS — BP 112/72 | HR 83 | Temp 98.7°F | Ht 70.0 in | Wt 234.0 lb

## 2023-09-30 DIAGNOSIS — M545 Low back pain, unspecified: Secondary | ICD-10-CM | POA: Diagnosis not present

## 2023-09-30 MED ORDER — TIZANIDINE HCL 2 MG PO CAPS: 2.0000 mg | ORAL_CAPSULE | Freq: Three times a day (TID) | ORAL | 0 refills | Status: DC | PRN

## 2023-09-30 NOTE — Progress Notes (Signed)
 Subjective:    Patient ID: Stephen Ramos., male    DOB: June 09, 1960, 63 y.o.   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 The next day--had trouble getting out of bed Spasms, tightness Ibuprofen  helped a lot--400mg  at a time Now it comes and goes---can be severe at times (10/10)--then later will be worse again after going to work  No radiation of pain---points to low lumbar paraspinal area No leg weakness Working in gym will worsen things ---will get pressure (walking or weight work)  Left over cyclobenzaprine  helped--but it makes his sleepy so can't use it in the day  Current Outpatient Medications on File Prior to Visit  Medication Sig Dispense Refill   MULTIPLE VITAMIN PO Take 1 tablet by mouth every other day.      No current facility-administered medications on file prior to visit.    No Known Allergies  Past Medical History:  Diagnosis Date   Chicken pox    Heart murmur 2011   Hordeolum internum left lower eyelid 10/11/2016   Pain in left thigh 09/18/2021   Plantar wart of right foot 05/11/2020   Prediabetes    Right shoulder pain 06/24/2020   Vertigo     Past Surgical History:  Procedure Laterality Date   CHOLECYSTECTOMY  2012    Family History  Problem Relation Age of Onset   Diabetes Mother    Diabetes Father    Heart disease Father 98       Myocardial   Kidney disease Father    Stroke Father    Hypertension Father    Colon cancer Neg Hx     Social History   Socioeconomic History   Marital status: Single    Spouse name: Not on file   Number of children: Not on file   Years of education: Not on file   Highest education level: Bachelor's degree (e.g., BA, AB, BS)  Occupational History   Not on file  Tobacco Use   Smoking status: Never   Smokeless tobacco: Never  Substance and Sexual Activity   Alcohol use: No    Alcohol/week: 0.0 standard drinks  of alcohol   Drug use: No   Sexual activity: Not on file  Other Topics Concern   Not on file  Social History Narrative   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   Works as a Engineer, civil (consulting).   Highest level of education is bachelors.   Works for [employer].   Enjoys traveling, reading, collecting coins.   Social Drivers of Corporate investment banker Strain: Low Risk  (06/30/2023)   Overall Financial Resource Strain (CARDIA)    Difficulty of Paying Living Expenses: Not hard at all  Food Insecurity: No Food Insecurity (06/30/2023)   Hunger Vital Sign    Worried About Running Out of Food in the Last Year: Never true    Ran Out of Food in the Last Year: Never true  Transportation Needs: No Transportation Needs (06/30/2023)   PRAPARE - Administrator, Civil Service (Medical): No    Lack of Transportation (Non-Medical): No  Physical Activity: Insufficiently Active (06/30/2023)   Exercise Vital Sign    Days of Exercise per Week: 3 days    Minutes of Exercise per Session: 30 min  Stress: No Stress Concern Present (06/30/2023)   [employer] of Occupational Health - Occupational Stress Questionnaire    Feeling of Stress :  Not at all  Social Connections: Moderately Integrated (06/30/2023)   Social Connection and Isolation Panel [NHANES]    Frequency of Communication with Friends and Family: More than three times a week    Frequency of Social Gatherings with Friends and Family: Once a week    Attends Religious Services: More than 4 times per year    Active Member of [employer] or Organizations: Yes    Attends Engineer, structural: More than 4 times per year    Marital Status: Divorced  Catering manager Violence: Not on file   Review of Systems 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 Under that--2 person lift only    Objective:   Physical Exam Musculoskeletal:     Comments: No spine or back tenderness Pain spot is top of left buttock SLR negative ROM okay in hips   Neurological:     Comments: Normal gait and muscle strength            Assessment & Plan:

## 2023-09-30 NOTE — Assessment & Plan Note (Addendum)
 Clearly seems to be muscular Recommended heat Ibuprofen  400mg  tid for 1-2 weeks Rx tizanidine 2-4 mg tid prn If persists, would set up with Dr Geralyn Knee

## 2023-10-07 ENCOUNTER — Encounter (HOSPITAL_COMMUNITY): Payer: Self-pay

## 2023-10-14 ENCOUNTER — Ambulatory Visit: Admitting: Family Medicine

## 2023-10-15 ENCOUNTER — Other Ambulatory Visit: Payer: Self-pay | Admitting: Infectious Diseases

## 2023-10-15 ENCOUNTER — Ambulatory Visit
Admission: RE | Admit: 2023-10-15 | Discharge: 2023-10-15 | Disposition: A | Discharge status: Home / Self Care | Source: Ambulatory Visit | Attending: Infectious Diseases | Admitting: Infectious Diseases

## 2023-10-15 DIAGNOSIS — R7612 Nonspecific reaction to cell mediated immunity measurement of gamma interferon antigen response without active tuberculosis: Secondary | ICD-10-CM

## 2024-03-23 ENCOUNTER — Ambulatory Visit: Payer: Self-pay

## 2024-03-23 NOTE — Telephone Encounter (Signed)
 FYI Only or Action Required?: FYI only for provider.  Patient was last seen in primary care on 09/30/2023 by Jimmy Charlie FERNS, MD.  Called Nurse Triage reporting Dizziness and Otalgia.  Symptoms began a week ago.  Interventions attempted: OTC medications: oil behind ear for vertigo.  Symptoms are: gradually worsening.  Triage Disposition: See Physician Within 24 Hours  Patient/caregiver understands and will follow disposition?: YesCopied from CRM #[id number]. Topic: Clinical - Red Word Triage >> Mar 23, 2024  8:55 AM Stephen Ramos wrote: Red Word that prompted transfer to Nurse Triage: believe has ear infection, burning and itchy. 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 Reason for Disposition  Earache  Answer Assessment - Initial Assessment Questions Pt states that he has been having burning, itching ears and also vertigo. He states that he has been using oil behind his ears for cruises a couple of times a week and that seems to help a bit.    1. LOCATION: Which ear is involved?     Both ears 2. ONSET: When did the ear pain start?      Both ears itchy, burning 3. SEVERITY: How bad is the pain?  (Scale 1-10; mild, moderate or severe)     6 4. URI SYMPTOMS: Do you have a runny nose or cough?     Runny nose started 2 days ago 5. FEVER: Do you have a fever? If Yes, ask: What is your temperature, how was it measured, and when did it start?     No  6. CAUSE: Have you been swimming recently?, How often do you use Q-TIPS?, Have you had any recent air travel or scuba diving?      7. OTHER SYMPTOMS: Do you have any other symptoms? (e.g., decreased hearing, dizziness, headache, stiff neck, vomiting)     vertigo 8. PREGNANCY: Is there any chance you are pregnant? When was your last menstrual period?  Protocols used: Earache-A-AH, Dizziness - Vertigo-A-AH

## 2024-03-24 ENCOUNTER — Ambulatory Visit: Payer: Self-pay | Admitting: Family Medicine

## 2024-03-24 ENCOUNTER — Encounter: Payer: Self-pay | Admitting: Family Medicine

## 2024-03-24 VITALS — BP 104/66 | HR 73 | Temp 98.4°F | Ht 70.0 in | Wt 227.1 lb

## 2024-03-24 DIAGNOSIS — H6123 Impacted cerumen, bilateral: Secondary | ICD-10-CM

## 2024-03-24 DIAGNOSIS — R42 Dizziness and giddiness: Secondary | ICD-10-CM

## 2024-03-24 DIAGNOSIS — H61893 Other specified disorders of external ear, bilateral: Secondary | ICD-10-CM

## 2024-03-24 NOTE — Assessment & Plan Note (Signed)
Cerumen impaction removal via irrigation Performed by : Hedy Camara Consent for procedure obtained verbally. Bilateral ears lavaged/ irrigated with warm water gently. Pt tolerated procedure well, with no complications. After procedure ears clear of cerumen impaction, with minimal ear canal irritation and redness, no bleeding. TM intact and symptoms improved.

## 2024-03-24 NOTE — Assessment & Plan Note (Signed)
 Chronic intermittent, now with acute flare likely secondary to caffeine intake.  No fluid behind eardrums, no sign of ear infection. Can consider home desensitization exercises.

## 2024-03-24 NOTE — Progress Notes (Signed)
 Patient ID: Stephen Ramos., male    DOB: [birth year], 63 y.o.   MRN: [id number]  This visit was conducted in person.  BP 104/66   Pulse 73   Temp 98.4 F (36.9 C) (Temporal)   Ht 5' 10 (1.778 m)   Wt 227 lb 2 oz (103 kg)   SpO2 98%   BMI 32.59 kg/m    CC:  Chief Complaint  Patient presents with   Dizziness   Ear Irritation    Bilateral    Subjective:   HPI: Jaedan Huttner. is a 63 y.o. male presenting on 03/24/2024 for Dizziness and Ear Irritation (Bilateral)   New onset  itching in ears in ear canal x1 week ago  Has used hair trimmer in ears... could have caused it.  No ear pain  No fever, no cough, no allergy symptoms.  Noting ringing in both ears.   He reports  vertigo x 5 days ago ... chocolate  and caffeine triggers his vertigo.  He has intermittent room spinning.. notes some worse in AM.   Triggered with head movement.  Has history of intermittent vertigo for years.   No recent new meds ... Not using muscle relaxer given SE.      Relevant past medical, surgical, family and social history reviewed and updated as indicated. Interim medical history since our last visit reviewed. Allergies and medications reviewed and updated. Outpatient Medications Prior to Visit  Medication Sig Dispense Refill   MULTIPLE VITAMIN PO Take 1 tablet by mouth every other day.      tizanidine  (ZANAFLEX ) 2 MG capsule Take 1-2 capsules (2-4 mg total) by mouth 3 (three) times daily as needed for muscle spasms. 60 capsule 0   methocarbamol (ROBAXIN) 500 MG tablet Take 500-1,000 mg by mouth every 6 (six) hours as needed.     No facility-administered medications prior to visit.     Per HPI unless specifically indicated in ROS section below Review of Systems  Constitutional:  Negative for fatigue and fever.  HENT:  Negative for ear pain.   Eyes:  Negative for pain.  Respiratory:  Negative for cough and shortness of breath.   Cardiovascular:  Negative for chest pain,  palpitations and leg swelling.  Gastrointestinal:  Negative for abdominal pain.  Genitourinary:  Negative for dysuria.  Musculoskeletal:  Negative for arthralgias.  Neurological:  Negative for syncope, light-headedness and headaches.  Psychiatric/Behavioral:  Negative for dysphoric mood.    Objective:  BP 104/66   Pulse 73   Temp 98.4 F (36.9 C) (Temporal)   Ht 5' 10 (1.778 m)   Wt 227 lb 2 oz (103 kg)   SpO2 98%   BMI 32.59 kg/m   Wt Readings from Last 3 Encounters:  03/24/24 227 lb 2 oz (103 kg)  09/30/23 234 lb (106.1 kg)  09/03/23 230 lb (104.3 kg)      Physical Exam Vitals reviewed.  Constitutional:      Appearance: He is well-developed.  HENT:     Head: Normocephalic.     Right Ear: Hearing normal.     Left Ear: Hearing normal.     Nose: Nose normal.  Neck:     Thyroid : No thyroid  mass or thyromegaly.     Vascular: No carotid bruit.     Trachea: Trachea normal.  Cardiovascular:     Rate and Rhythm: Normal rate and regular rhythm.     Pulses: Normal pulses.     Heart sounds: Heart sounds not  distant. No murmur heard.    No friction rub. No gallop.     Comments: No peripheral edema Pulmonary:     Effort: Pulmonary effort is normal. No respiratory distress.     Breath sounds: Normal breath sounds.  Skin:    General: Skin is warm and dry.     Findings: No rash.  Psychiatric:        Speech: Speech normal.        Behavior: Behavior normal.        Thought Content: Thought content normal.       Results for orders placed or performed in visit on 08/20/23  Lipid panel   Collection Time: 08/20/23 10:54 AM  Result Value Ref Range   Cholesterol 180 0 - 200 mg/dL   Triglycerides 864.9 0.0 - 149.0 mg/dL   HDL 60.19 >60.99 mg/dL   VLDL 72.9 0.0 - 59.9 mg/dL   LDL Cholesterol 885 (H) 0 - 99 mg/dL   Total CHOL/HDL Ratio 5    NonHDL 140.54   Comprehensive metabolic panel   Collection Time: 08/20/23 10:54 AM  Result Value Ref Range   Sodium 138 135 - 145 mEq/L    Potassium 4.6 3.5 - 5.1 mEq/L   Chloride 104 96 - 112 mEq/L   CO2 29 19 - 32 mEq/L   Glucose, Bld 89 70 - 99 mg/dL   BUN 22 6 - 23 mg/dL   Creatinine, Ser 8.83 0.40 - 1.50 mg/dL   Total Bilirubin 0.6 0.2 - 1.2 mg/dL   Alkaline Phosphatase 45 39 - 117 U/L   AST 21 0 - 37 U/L   ALT 31 0 - 53 U/L   Total Protein 7.3 6.0 - 8.3 g/dL   Albumin 4.5 3.5 - 5.2 g/dL   GFR 31.53 >39.99 mL/min   Calcium 9.9 8.4 - 10.5 mg/dL  CBC   Collection Time: 08/20/23 10:54 AM  Result Value Ref Range   WBC 4.8 4.0 - 10.5 K/uL   RBC 4.84 4.22 - 5.81 Mil/uL   Platelets 204.0 150.0 - 400.0 K/uL   Hemoglobin 14.6 13.0 - 17.0 g/dL   HCT 56.0 60.9 - 47.9 %   MCV 90.7 78.0 - 100.0 fl   MCHC 33.3 30.0 - 36.0 g/dL   RDW 87.0 88.4 - 84.4 %  TSH   Collection Time: 08/20/23 10:54 AM  Result Value Ref Range   TSH 1.43 0.35 - 5.50 uIU/mL  Hemoglobin A1c   Collection Time: 08/20/23 10:54 AM  Result Value Ref Range   Hgb A1c MFr Bld 5.8 4.6 - 6.5 %  PSA   Collection Time: 08/20/23 10:54 AM  Result Value Ref Range   PSA 0.99 0.10 - 4.00 ng/mL    Assessment and Plan  Bilateral impacted cerumen Assessment & Plan: Cerumen impaction removal via irrigation Performed by :  Arland Morel Consent for procedure obtained verbally. Bilateral ears lavaged/ irrigated with warm water gently. Pt tolerated procedure well, with no complications. After procedure ears clear of cerumen impaction, with minimal ear canal irritation and redness, no bleeding. TM intact and symptoms improved.    Irritation of external ear canal, bilateral Assessment & Plan:  Acute, no rash or changes seen in the ear canal.  Slight dry skin at external ear opening.  Recommend topical over-the-counter hydrocortisone cream applied to area twice daily for likely irritation from clippers. No yeast infection or bacterial infection seen   Vertigo Assessment & Plan: Chronic intermittent, now with acute flare likely secondary to caffeine  intake.  No fluid behind eardrums, no sign of ear infection. Can consider home desensitization exercises.     No follow-ups on file.   Greig Ring, MD

## 2024-03-24 NOTE — Assessment & Plan Note (Signed)
 Acute, no rash or changes seen in the ear canal.  Slight dry skin at external ear opening.  Recommend topical over-the-counter hydrocortisone cream applied to area twice daily for likely irritation from clippers. No yeast infection or bacterial infection seen

## 2024-03-24 NOTE — Telephone Encounter (Signed)
 Appointment with Dr. Avelina 03/24/2024 at 2:00 pm.

## 2024-04-07 ENCOUNTER — Encounter: Payer: Self-pay | Admitting: Primary Care

## 2024-08-20 ENCOUNTER — Encounter: Payer: Self-pay | Admitting: Primary Care
